# Patient Record
Sex: Male | Born: 1965 | Race: White | Hispanic: No | Marital: Married | Smoking: Never smoker
Health system: Southern US, Community
[De-identification: ages and names within clinical notes are randomized; demographics above are authoritative.]

## PROBLEM LIST (undated history)

## (undated) DIAGNOSIS — G4730 Sleep apnea, unspecified: Secondary | ICD-10-CM

## (undated) DIAGNOSIS — M109 Gout, unspecified: Secondary | ICD-10-CM

## (undated) DIAGNOSIS — I10 Essential (primary) hypertension: Secondary | ICD-10-CM

## (undated) DIAGNOSIS — E669 Obesity, unspecified: Secondary | ICD-10-CM

## (undated) HISTORY — DX: Gilbert syndrome: E80.4

## (undated) HISTORY — DX: Sleep apnea, unspecified: G47.30

## (undated) HISTORY — DX: Obesity, unspecified: E66.9

## (undated) HISTORY — PX: HERNIA REPAIR: SHX51

## (undated) HISTORY — DX: Essential (primary) hypertension: I10

## (undated) HISTORY — DX: Gout, unspecified: M10.9

## (undated) HISTORY — PX: CHOLECYSTECTOMY: SHX55

---

## 1999-06-30 ENCOUNTER — Ambulatory Visit (HOSPITAL_COMMUNITY): Admission: RE | Admit: 1999-06-30 | Discharge: 1999-06-30 | Payer: Self-pay | Admitting: *Deleted

## 2005-01-25 ENCOUNTER — Ambulatory Visit: Payer: Self-pay

## 2007-11-27 ENCOUNTER — Emergency Department (HOSPITAL_BASED_OUTPATIENT_CLINIC_OR_DEPARTMENT_OTHER): Admission: EM | Admit: 2007-11-27 | Discharge: 2007-11-27 | Payer: Self-pay | Admitting: Emergency Medicine

## 2010-08-19 NOTE — Cardiovascular Report (Signed)
[place]. [facility]  Patient:    Andrew Price, Andrew Price              MRN: [id number] Proc. Date: 06/30/99 Adm. Date:  [id number] Attending:  Meade Maw A CC:         Marinda Elk, M.D.                        Cardiac Catheterization  PROCEDURE PERFORMED:  Left heart catheterization, coronary angiography, single plane ventriculogram.  INDICATIONS FOR PROCEDURE:  Chest pain associated with positive stress test.  REFERRING PHYSICIAN:  Marinda Elk, M.D.  DESCRIPTION OF PROCEDURE:  After obtaining written informed consent, the patient was brought to the cardiac catheterization lab in the postabsorptive state. Preop sedation was achieved using IV Versed.  The right groin was prepped and draped n the usual sterile fashion.  The right femoral head was identified using radiographic technique.  A 6 French hemostasis sheath was placed into the right  femoral artery using modified Seldinger technique.  Selective coronary angiography was performed using a JL4, JR4 Judkins catheter.  Nonionic contrast was used and was hand injected.  A single plane ventriculogram was performed in the RAO position using a 6 French pigtail curved catheter.  Nonionic contrast was used and was hand injected.  A single plane ventriculogram was performed in the RAO position using a 6 French pigtail curved catheter.  Nonionic contrast was used and was power injected.  All catheter exchanges were made over a guide wire.  The hemostasis sheath was placed following each injection.  FINDINGS:  The aortic pressure is 127/85.  LV pressure is 121/20.  Single plane  ventriculogram revealed normal wall motion with ejection fraction of 65%.  Coronary angiography:  The left main coronary artery bifurcated into the left anterior descending and circumflex vessel.  There is no significant disease in he left main coronary artery.  Left anterior descending:  The left anterior descending  gave rise to a moderate D1, moderate D2 ended as an apical recurrent branch.  There is no significant disease in the left anterior descending or its branches.  Circumflex vessel:  The circumflex vessel gave rise to a moderate sized OM1, small OM2 and had no significant disease.  Right coronary artery:  The right coronary artery was a large artery, was dominant for the posterior circulation, gave rise to a large PDA and two large PL branches. There was no significant disease in the right coronary artery.  IMPRESSION: 1. Normal coronaries. 2. Normal left ventricular function.  RECOMMENDATIONS:  Consider other etiologies for his chest pain. DD:  06/30/99 TD:  06/30/99 Job: [id number]

## 2010-09-22 ENCOUNTER — Emergency Department (HOSPITAL_BASED_OUTPATIENT_CLINIC_OR_DEPARTMENT_OTHER)
Admission: EM | Admit: 2010-09-22 | Discharge: 2010-09-23 | Disposition: A | Discharge status: Home / Self Care | Payer: BC Managed Care – PPO | Attending: Emergency Medicine | Admitting: Emergency Medicine

## 2010-09-22 ENCOUNTER — Emergency Department (INDEPENDENT_AMBULATORY_CARE_PROVIDER_SITE_OTHER): Payer: BC Managed Care – PPO

## 2010-09-22 DIAGNOSIS — J029 Acute pharyngitis, unspecified: Secondary | ICD-10-CM | POA: Insufficient documentation

## 2010-09-22 DIAGNOSIS — R05 Cough: Secondary | ICD-10-CM

## 2010-09-22 DIAGNOSIS — E041 Nontoxic single thyroid nodule: Secondary | ICD-10-CM

## 2010-09-22 DIAGNOSIS — I10 Essential (primary) hypertension: Secondary | ICD-10-CM | POA: Insufficient documentation

## 2010-09-22 MED ORDER — IOHEXOL 300 MG/ML  SOLN: 80.0000 mL | Freq: Once | INTRAMUSCULAR | Status: AC | PRN

## 2010-09-23 LAB — CBC
HCT: 39.6 % (ref 39.0–52.0)
Hemoglobin: 15 g/dL (ref 13.0–17.0)
MCH: 32.7 pg (ref 26.0–34.0)
MCHC: 37.9 g/dL — ABNORMAL HIGH (ref 30.0–36.0)

## 2010-09-23 LAB — BASIC METABOLIC PANEL
CO2: 26 mEq/L (ref 19–32)
Calcium: 9.3 mg/dL (ref 8.4–10.5)
Glucose, Bld: 94 mg/dL (ref 70–99)
Potassium: 3.8 mEq/L (ref 3.5–5.1)
Sodium: 139 mEq/L (ref 135–145)

## 2010-09-23 LAB — DIFFERENTIAL
Lymphocytes Relative: 27 % (ref 12–46)
Monocytes Absolute: 0.6 10*3/uL (ref 0.1–1.0)
Monocytes Relative: 9 % (ref 3–12)
Neutro Abs: 4.2 10*3/uL (ref 1.7–7.7)

## 2010-09-23 MED ORDER — IOHEXOL 300 MG/ML  SOLN
80.0000 mL | Freq: Once | INTRAMUSCULAR | Status: AC | PRN
  Administered 2010-09-23: 80 mL via INTRAVENOUS

## 2010-12-07 ENCOUNTER — Other Ambulatory Visit (HOSPITAL_BASED_OUTPATIENT_CLINIC_OR_DEPARTMENT_OTHER): Payer: Self-pay | Admitting: Family Medicine

## 2010-12-07 DIAGNOSIS — E041 Nontoxic single thyroid nodule: Secondary | ICD-10-CM

## 2010-12-09 ENCOUNTER — Ambulatory Visit (HOSPITAL_BASED_OUTPATIENT_CLINIC_OR_DEPARTMENT_OTHER)
Admission: RE | Admit: 2010-12-09 | Discharge: 2010-12-09 | Disposition: A | Discharge status: Home / Self Care | Payer: BC Managed Care – PPO | Source: Ambulatory Visit | Attending: Family Medicine | Admitting: Family Medicine

## 2010-12-09 DIAGNOSIS — E049 Nontoxic goiter, unspecified: Secondary | ICD-10-CM | POA: Insufficient documentation

## 2010-12-09 DIAGNOSIS — E041 Nontoxic single thyroid nodule: Secondary | ICD-10-CM

## 2010-12-13 ENCOUNTER — Other Ambulatory Visit: Payer: Self-pay | Admitting: Family Medicine

## 2010-12-13 DIAGNOSIS — E041 Nontoxic single thyroid nodule: Secondary | ICD-10-CM

## 2010-12-19 ENCOUNTER — Ambulatory Visit
Admission: RE | Admit: 2010-12-19 | Discharge: 2010-12-19 | Disposition: A | Discharge status: Home / Self Care | Payer: BC Managed Care – PPO | Source: Ambulatory Visit | Attending: Family Medicine | Admitting: Family Medicine

## 2010-12-19 ENCOUNTER — Other Ambulatory Visit (HOSPITAL_COMMUNITY)
Admission: RE | Admit: 2010-12-19 | Discharge: 2010-12-19 | Disposition: A | Discharge status: Home / Self Care | Payer: BC Managed Care – PPO | Source: Ambulatory Visit | Attending: Interventional Radiology | Admitting: Interventional Radiology

## 2010-12-19 DIAGNOSIS — E049 Nontoxic goiter, unspecified: Secondary | ICD-10-CM | POA: Insufficient documentation

## 2010-12-19 DIAGNOSIS — E041 Nontoxic single thyroid nodule: Secondary | ICD-10-CM

## 2013-01-08 ENCOUNTER — Ambulatory Visit (INDEPENDENT_AMBULATORY_CARE_PROVIDER_SITE_OTHER): Payer: 59

## 2013-01-08 ENCOUNTER — Encounter: Payer: Self-pay | Admitting: Podiatry

## 2013-01-08 ENCOUNTER — Ambulatory Visit (INDEPENDENT_AMBULATORY_CARE_PROVIDER_SITE_OTHER): Payer: 59 | Admitting: Podiatry

## 2013-01-08 VITALS — BP 127/74 | HR 69 | Resp 16 | Ht 70.0 in | Wt 260.0 lb

## 2013-01-08 DIAGNOSIS — M722 Plantar fascial fibromatosis: Secondary | ICD-10-CM

## 2013-01-08 MED ORDER — TRIAMCINOLONE ACETONIDE 10 MG/ML IJ SUSP
5.0000 mg | Freq: Once | INTRAMUSCULAR | Status: AC
  Administered 2013-01-08: 5 mg via INTRA_ARTICULAR

## 2013-01-08 MED ORDER — MELOXICAM 15 MG PO TABS: 15.0000 mg | ORAL_TABLET | Freq: Every day | ORAL | Status: DC

## 2013-01-08 NOTE — Progress Notes (Signed)
N-sharp L-plantar heel right D-1 month O-gradual C-AM pain, worse at end of the day A-standing, walking, certain shoes T-massaging, soaks, stretching  **Wears dress shoes to work daily and spends a lot of time standing on concrete floors**

## 2013-01-08 NOTE — Progress Notes (Signed)
°  Subjective:    Patient ID: Andrew Price, male    DOB: [birth year], 47 y.o.   MRN: [id number]  HPI    Review of Systems     Objective:   Physical Exam        Assessment & Plan:

## 2013-01-09 NOTE — Progress Notes (Signed)
Subjective:     Patient ID: Andrew Price, male   DOB: 09-20-1965, 47 y.o.   MRN: [id number]  Foot Pain   47 year old male who complains of pain in his right heel one-month ration. He states the pain has intensified over that period of time and he is increasingly having difficulty with Ambulation  Review of Systems  All other systems reviewed and are negative.       Objective:   Physical Exam  Nursing note and vitals reviewed. Constitutional: He is oriented to person, place, and time.  Cardiovascular: Intact distal pulses.   Musculoskeletal: Normal range of motion.  Neurological: He is alert and oriented to person, place, and time. He has normal reflexes.  Skin: Skin is warm.   Upon palpation I noted pain to the medial calcaneal tubercle.    Assessment:     Plantar fasciitis of the right heel.    Plan:     Initial H&P performed. X-rays reviewed with patient. Injected the right plantar fascia 3 mg Kenalog 5 mg Xylocaine Marcaine mixture. Dispensed fascial brace. Tried Mobic 15 mg daily and advised on return in 1 week.

## 2013-01-09 NOTE — Progress Notes (Deleted)
Subjective:     Patient ID: Andrew Price, male   DOB: Oct 18, 1965, 47 y.o.   MRN: [id number]  HPI   Review of Systems     Objective:   Physical Exam     Assessment:     ***    Plan:     ***

## 2013-01-15 ENCOUNTER — Ambulatory Visit: Payer: Self-pay | Admitting: Podiatry

## 2013-01-20 ENCOUNTER — Ambulatory Visit (INDEPENDENT_AMBULATORY_CARE_PROVIDER_SITE_OTHER): Payer: 59 | Admitting: Podiatry

## 2013-01-20 ENCOUNTER — Encounter: Payer: Self-pay | Admitting: Podiatry

## 2013-01-20 VITALS — BP 134/80 | HR 79 | Resp 16

## 2013-01-20 DIAGNOSIS — M722 Plantar fascial fibromatosis: Secondary | ICD-10-CM

## 2013-01-20 MED ORDER — TRIAMCINOLONE ACETONIDE 10 MG/ML IJ SUSP
5.0000 mg | Freq: Once | INTRAMUSCULAR | Status: AC
  Administered 2013-01-20: 5 mg via INTRA_ARTICULAR

## 2013-01-20 NOTE — Patient Instructions (Signed)
Wear as much as possible and wear shoe with elevated heel on opposite foot

## 2013-01-20 NOTE — Progress Notes (Signed)
Subjective:     Patient ID: Andrew Price, male   DOB: 28-May-1965, 47 y.o.   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       Objective:   Physical Exam  Vitals reviewed. Cardiovascular: Intact distal pulses.   Musculoskeletal: Normal range of motion.  Neurological: He is alert.  Skin: Skin is warm.   upon palpation of the right center and medial band of the plantar fascia is very tender with mild edema noted     Assessment:     Plantar fasciitis right with acute inflammation worse on weightbearing    Plan:     Reviewed treatment options at this point him mobilization with air fracture walker was accomplished with all instructions on usage. Injected the right plantar fascia 3 mW Kenalog 5 mg Xylocaine Marcaine mixture to reduce inflammation

## 2013-02-08 ENCOUNTER — Encounter: Payer: Self-pay | Admitting: Cardiology

## 2013-02-10 ENCOUNTER — Ambulatory Visit (INDEPENDENT_AMBULATORY_CARE_PROVIDER_SITE_OTHER): Payer: 59 | Admitting: Cardiology

## 2013-02-10 ENCOUNTER — Ambulatory Visit: Payer: 59 | Admitting: Podiatry

## 2013-02-10 ENCOUNTER — Encounter: Payer: Self-pay | Admitting: Cardiology

## 2013-02-10 VITALS — BP 112/82 | HR 77 | Ht 70.0 in | Wt 251.0 lb

## 2013-02-10 DIAGNOSIS — E669 Obesity, unspecified: Secondary | ICD-10-CM

## 2013-02-10 DIAGNOSIS — G4730 Sleep apnea, unspecified: Secondary | ICD-10-CM | POA: Insufficient documentation

## 2013-02-10 DIAGNOSIS — I10 Essential (primary) hypertension: Secondary | ICD-10-CM

## 2013-02-10 NOTE — Patient Instructions (Signed)
Your physician recommends that you continue on your current medications as directed. Please refer to the Current Medication list given to you today.  Your physician wants you to follow-up in: 6 Months with Dr. Sherlyn Lick will receive a reminder letter in the mail two months in advance. If you don't receive a letter, please call our office to schedule the follow-up appointment.  We contacted [employer] about getting a download on  Your CPAP machine. They will be contacting you with in the next week.

## 2013-02-10 NOTE — Progress Notes (Signed)
  [address] [place], [place]  [address] Phone: [phone] Fax:  [phone]  Date:  02/10/2013   ID:  Andrew Price, DOB 1965/11/08, MRN [id number]  PCP:  Lenora Boys, MD  Cardiologist:  Armanda Magic, MD     History of Present Illness: Andrew Price is a 47 y.o. male with a history of OSA/ HTN and obesity who presents today for followup.  He is doing well with his CPAP therapy.  He says that the mask is leaking.  He uses a nasal pillow mask which he tolerates well.  He feels the pressure is adequate.  He feels rested when he awakens and has no daytime sleepiness.  He walks for exercise although it currently is limited due to a flare of plantar fasciitis.   Wt Readings from Last 3 Encounters:  02/10/13 251 lb (113.853 kg)  01/08/13 260 lb (117.935 kg)     Past Medical History  Diagnosis Date  . Gout   . Mild sleep apnea   . Gilbert syndrome   . Hypertension   . Obesity (BMI 30-39.9)     Current Outpatient Prescriptions  Medication Sig Dispense Refill  . colchicine 0.6 MG tablet Take 0.6 mg by mouth daily as needed.      . indomethacin (INDOCIN) 50 MG capsule Take 50 mg by mouth as needed.      Marland Kitchen lisinopril-hydrochlorothiazide (PRINZIDE,ZESTORETIC) 10-12.5 MG per tablet Take 1 tablet by mouth daily.       No current facility-administered medications for this visit.    Allergies:    Allergies  Allergen Reactions  . Flexeril [Cyclobenzaprine]     Hyperactive/Drowsiness    Social History:  The patient  reports that he has never smoked. He does not have any smokeless tobacco history on file. He reports that he drinks alcohol. He reports that he does not use illicit drugs.   Family History:  The patient's family history includes Breast cancer in his mother; Hyperlipidemia in his brother; Hypertension in his father; Ovarian cancer in his mother; Pulmonary fibrosis in his father.   ROS:  Please see the history of present illness.       All other systems reviewed and negative.   PHYSICAL EXAM: VS:  BP 112/82  Pulse 77  Ht 5\' 10"  (1.778 m)  Wt 251 lb (113.853 kg)  BMI 36.01 kg/m2 Well nourished, well developed, in no acute distress HEENT: normal Neck: no JVD Cardiac:  normal S1, S2; RRR; no murmur Lungs:  clear to auscultation bilaterally, no wheezing, rhonchi or rales Abd: soft, nontender, no hepatomegaly Ext: no edema Skin: warm and dry Neuro:  CNs 2-12 intact, no focal abnormalities noted       ASSESSMENT AND PLAN:  1. OSA on CPAP therapy  - I will get a download from his DME 2.  HTN - well controlled   - continue Lisinopril HCT 3.  Obesity - I encouraged him to try to exercise as much has his foot problems would let him  Followup with me in 6 months  Signed, Armanda Magic, MD 02/10/2013 11:23 AM

## 2013-10-01 ENCOUNTER — Encounter (HOSPITAL_BASED_OUTPATIENT_CLINIC_OR_DEPARTMENT_OTHER): Payer: Self-pay | Admitting: Emergency Medicine

## 2013-10-01 ENCOUNTER — Emergency Department (HOSPITAL_BASED_OUTPATIENT_CLINIC_OR_DEPARTMENT_OTHER)
Admission: EM | Admit: 2013-10-01 | Discharge: 2013-10-01 | Disposition: A | Discharge status: Home / Self Care | Payer: 59 | Attending: Emergency Medicine | Admitting: Emergency Medicine

## 2013-10-01 ENCOUNTER — Emergency Department (HOSPITAL_BASED_OUTPATIENT_CLINIC_OR_DEPARTMENT_OTHER): Payer: 59

## 2013-10-01 DIAGNOSIS — I10 Essential (primary) hypertension: Secondary | ICD-10-CM | POA: Insufficient documentation

## 2013-10-01 DIAGNOSIS — R079 Chest pain, unspecified: Secondary | ICD-10-CM

## 2013-10-01 DIAGNOSIS — R05 Cough: Secondary | ICD-10-CM | POA: Insufficient documentation

## 2013-10-01 DIAGNOSIS — E669 Obesity, unspecified: Secondary | ICD-10-CM | POA: Insufficient documentation

## 2013-10-01 DIAGNOSIS — R059 Cough, unspecified: Secondary | ICD-10-CM | POA: Insufficient documentation

## 2013-10-01 DIAGNOSIS — Z79899 Other long term (current) drug therapy: Secondary | ICD-10-CM | POA: Insufficient documentation

## 2013-10-01 DIAGNOSIS — M109 Gout, unspecified: Secondary | ICD-10-CM | POA: Insufficient documentation

## 2013-10-01 DIAGNOSIS — Z791 Long term (current) use of non-steroidal anti-inflammatories (NSAID): Secondary | ICD-10-CM | POA: Insufficient documentation

## 2013-10-01 LAB — BASIC METABOLIC PANEL
Anion gap: 15 (ref 5–15)
BUN: 15 mg/dL (ref 6–23)
CHLORIDE: 98 meq/L (ref 96–112)
CO2: 25 mEq/L (ref 19–32)
Calcium: 9.8 mg/dL (ref 8.4–10.5)
Creatinine, Ser: 1.1 mg/dL (ref 0.50–1.35)
GFR calc Af Amer: >90 mL/min (ref >90)
GFR, EST NON AFRICAN AMERICAN: 78 mL/min — AB (ref >90)
GLUCOSE: 94 mg/dL (ref 70–99)
POTASSIUM: 3.9 meq/L (ref 3.7–5.3)
SODIUM: 138 meq/L (ref 137–147)

## 2013-10-01 LAB — CBC
HEMATOCRIT: 44.9 % (ref 39.0–52.0)
HEMOGLOBIN: 16.6 g/dL (ref 13.0–17.0)
MCH: 33.3 pg (ref 26.0–34.0)
MCHC: 37 g/dL — AB (ref 30.0–36.0)
MCV: 90 fL (ref 78.0–100.0)
Platelets: 158 10*3/uL (ref 150–400)
RBC: 4.99 MIL/uL (ref 4.22–5.81)
RDW: 13.6 % (ref 11.5–15.5)
WBC: 11.1 10*3/uL — AB (ref 4.0–10.5)

## 2013-10-01 LAB — TROPONIN I: Troponin I: <0.3 ng/mL (ref <0.30)

## 2013-10-01 MED ORDER — ALBUTEROL SULFATE (2.5 MG/3ML) 0.083% IN NEBU
2.5000 mg | INHALATION_SOLUTION | RESPIRATORY_TRACT | Status: DC | PRN
  Administered 2013-10-01: 2.5 mg via RESPIRATORY_TRACT
  Filled 2013-10-01: qty 3

## 2013-10-01 NOTE — ED Provider Notes (Signed)
CSN: [id number]     Arrival date & time 10/01/13  1424 History   First MD Initiated Contact with Patient 10/01/13 1447     Chief Complaint  Patient presents with  . Chest Pain      HPI  Patient presents with some chest discomfort. He awakened during the night. He states he felt like a tight as well localized to his left upper chest adjacent to the sternum. He felt the need to  cough or takes deep breaths. Is able to fall back asleep. Upon awakening at 7 AM it was still present , and present through the course of the day. He feels somewhat better after coughing. Is taking Mucinex. Continues to have a frequent dry cough. No fevers. No chills. Is not dyspneic or frank or short of breath. No exertional symptoms.  Past Medical History  Diagnosis Date  . Gout   . Mild sleep apnea   . Gilbert syndrome   . Hypertension   . Obesity (BMI 30-39.9)    Past Surgical History  Procedure Laterality Date  . Cholecystectomy    . Hernia repair     Family History  Problem Relation Age of Onset  . Breast cancer Mother   . Ovarian cancer Mother   . Hypertension Father   . Pulmonary fibrosis Father   . Hyperlipidemia Brother    History  Substance Use Topics  . Smoking status: Never Smoker   . Smokeless tobacco: Not on file  . Alcohol Use: Yes     Comment: social    Review of Systems  Constitutional: Negative for fever, chills, diaphoresis, appetite change and fatigue.  HENT: Negative for mouth sores, sore throat and trouble swallowing.   Eyes: Negative for visual disturbance.  Respiratory: Positive for cough and chest tightness. Negative for shortness of breath and wheezing.   Cardiovascular: Positive for chest pain.  Gastrointestinal: Negative for nausea, vomiting, abdominal pain, diarrhea and abdominal distention.  Endocrine: Negative for polydipsia, polyphagia and polyuria.  Genitourinary: Negative for dysuria, frequency and hematuria.  Musculoskeletal: Negative for gait problem.   Skin: Negative for color change, pallor and rash.  Neurological: Negative for dizziness, syncope, light-headedness and headaches.  Hematological: Does not bruise/bleed easily.  Psychiatric/Behavioral: Negative for behavioral problems and confusion.      Allergies  Flexeril  Home Medications   Prior to Admission medications   Medication Sig Start Date End Date Taking? Authorizing Provider  colchicine 0.6 MG tablet Take 0.6 mg by mouth daily as needed.    Historical Provider, MD  indomethacin (INDOCIN) 50 MG capsule Take 50 mg by mouth as needed.    Historical Provider, MD  lisinopril-hydrochlorothiazide (PRINZIDE,ZESTORETIC) 10-12.5 MG per tablet Take 1 tablet by mouth daily.    Historical Provider, MD   BP 122/77  Pulse 77  Temp(Src) 98.5 F (36.9 C) (Oral)  Resp 12  SpO2 100% Physical Exam  Constitutional: He is oriented to person, place, and time. He appears well-developed and well-nourished. No distress.  HENT:  Head: Normocephalic.  Eyes: Conjunctivae are normal. Pupils are equal, round, and reactive to light. No scleral icterus.  Neck: Normal range of motion. Neck supple. No thyromegaly present.  Cardiovascular: Normal rate and regular rhythm.  Exam reveals no gallop and no friction rub.   No murmur heard. Pulmonary/Chest: Effort normal and breath sounds normal. No respiratory distress. He has no wheezes. He has no rales.  Abdominal: Soft. Bowel sounds are normal. He exhibits no distension. There is no tenderness. There is  no rebound.  Musculoskeletal: Normal range of motion.  Neurological: He is alert and oriented to person, place, and time.  Skin: Skin is warm and dry. No rash noted.  Psychiatric: He has a normal mood and affect. His behavior is normal.    ED Course  Procedures (including critical care time) Labs Review Labs Reviewed  CBC - Abnormal; Notable for the following:    WBC 11.1 (*)    MCHC 37.0 (*)    All other components within normal limits   BASIC METABOLIC PANEL - Abnormal; Notable for the following:    GFR calc non Af Amer 78 (*)    All other components within normal limits  TROPONIN I    Imaging Review Dg Chest 2 View  10/01/2013   CLINICAL DATA:  48 year old male with chest pain radiating from the center to the left. Shortness of breath and dizziness. Initial encounter.  EXAM: CHEST  2 VIEW  COMPARISON:  None.  FINDINGS: Lung volumes within normal limits. Normal cardiac size and mediastinal contours. Visualized tracheal air column is within normal limits. The lungs are clear. No pneumothorax or effusion. No acute osseous abnormality identified.  IMPRESSION: Negative, no acute cardiopulmonary abnormality.   Electronically Signed   By: Augusto GambleLee  Hall M.D.   On: 10/01/2013 15:26     EKG Interpretation   Date/Time:  Wednesday October 01 2013 14:29:40 EDT Ventricular Rate:  69 PR Interval:  148 QRS Duration: 88 QT Interval:  410 QTC Calculation: 439 R Axis:   49 Text Interpretation:  Normal sinus rhythm Normal ECG Confirmed by Fayrene FearingJAMES   MD, Giann Obara ([id number]) on 10/01/2013 2:48:01 PM      MDM   Final diagnoses:  Chest pain, unspecified chest pain type    Patient has a normal EKG. He has a normal troponin. This after 12 hours of symptoms. Has a heart score of 2 (Age, and Htn).  The clinical suspicion of this being cardiac is low. He had no improvement or change in symptoms based on one albuterol treatment. His normal chest x-ray. It is appropriate for outpatient treatment. Careful return precautions review worsening or changing symptoms. 13 Mucinex. Recheck with any other changes.    Rolland PorterMark Oreste Majeed, MD 10/01/13 [phone]

## 2013-10-01 NOTE — Discharge Instructions (Signed)
Continue your mucinex am and pm. Return here with any change in, or worsening symptoms.  Chest Pain (Nonspecific) It is often hard to give a specific diagnosis for the cause of chest pain. There is always a chance that your pain could be related to something serious, such as a heart attack or a blood clot in the lungs. You need to follow up with your health care provider for further evaluation. CAUSES   Heartburn.  Pneumonia or bronchitis.  Anxiety or stress.  Inflammation around your heart (pericarditis) or lung (pleuritis or pleurisy).  A blood clot in the lung.  A collapsed lung (pneumothorax). It can develop suddenly on its own (spontaneous pneumothorax) or from trauma to the chest.  Shingles infection (herpes zoster virus). The chest wall is composed of bones, muscles, and cartilage. Any of these can be the source of the pain.  The bones can be bruised by injury.  The muscles or cartilage can be strained by coughing or overwork.  The cartilage can be affected by inflammation and become sore (costochondritis). DIAGNOSIS  Lab tests or other studies may be needed to find the cause of your pain. Your health care provider may have you take a test called an ambulatory electrocardiogram (ECG). An ECG records your heartbeat patterns over a 24-hour period. You may also have other tests, such as:  Transthoracic echocardiogram (TTE). During echocardiography, sound waves are used to evaluate how blood flows through your heart.  Transesophageal echocardiogram (TEE).  Cardiac monitoring. This allows your health care provider to monitor your heart rate and rhythm in real time.  Holter monitor. This is a portable device that records your heartbeat and can help diagnose heart arrhythmias. It allows your health care provider to track your heart activity for several days, if needed.  Stress tests by exercise or by giving medicine that makes the heart beat faster. TREATMENT   Treatment  depends on what may be causing your chest pain. Treatment may include:  Acid blockers for heartburn.  Anti-inflammatory medicine.  Pain medicine for inflammatory conditions.  Antibiotics if an infection is present.  You may be advised to change lifestyle habits. This includes stopping smoking and avoiding alcohol, caffeine, and chocolate.  You may be advised to keep your head raised (elevated) when sleeping. This reduces the chance of acid going backward from your stomach into your esophagus. Most of the time, nonspecific chest pain will improve within 2-3 days with rest and mild pain medicine.  HOME CARE INSTRUCTIONS   If antibiotics were prescribed, take them as directed. Finish them even if you start to feel better.  For the next few days, avoid physical activities that bring on chest pain. Continue physical activities as directed.  Do not use any tobacco products, including cigarettes, chewing tobacco, or electronic cigarettes.  Avoid drinking alcohol.  Only take medicine as directed by your health care provider.  Follow your health care provider's suggestions for further testing if your chest pain does not go away.  Keep any follow-up appointments you made. If you do not go to an appointment, you could develop lasting (chronic) problems with pain. If there is any problem keeping an appointment, call to reschedule. SEEK MEDICAL CARE IF:   Your chest pain does not go away, even after treatment.  You have a rash with blisters on your chest.  You have a fever. SEEK IMMEDIATE MEDICAL CARE IF:   You have increased chest pain or pain that spreads to your arm, neck, jaw, back, or  abdomen.  You have shortness of breath.  You have an increasing cough, or you cough up blood.  You have severe back or abdominal pain.  You feel nauseous or vomit.  You have severe weakness.  You faint.  You have chills. This is an emergency. Do not wait to see if the pain will go away. Get  medical help at once. Call your local emergency services (911 in [place] Do not drive yourself to the hospital. MAKE SURE YOU:   Understand these instructions.  Will watch your condition.  Will get help right away if you are not doing well or get worse. Document Released: 12/28/2004 Document Revised: 03/25/2013 Document Reviewed: 10/24/2007 [facility] Patient Information 2015 [place], [place]. This information is not intended to replace advice given to you by your health care provider. Make sure you discuss any questions you have with your health care provider.

## 2013-10-01 NOTE — ED Notes (Signed)
Pt c/o Cp x 12 hrs

## 2014-05-05 ENCOUNTER — Telehealth: Payer: Self-pay | Admitting: Cardiology

## 2014-05-05 NOTE — Telephone Encounter (Signed)
Left pt a message to call back. 

## 2014-05-05 NOTE — Telephone Encounter (Signed)
New message     Want to pick up presc for his cpap machine.  He has one at home, he travels and want to purchase a mini machine to take with him when he travels.  He found a company to buy one but they need the presc.  He is leaving for [place] soon and want to come by today and pick it up.  Please call when ready

## 2014-05-05 NOTE — Telephone Encounter (Signed)
Follow up      Pt has the fax number on where to fax the cpap presc.  Please fax it to the [facility]AP Shop at [phone].

## 2014-05-05 NOTE — Telephone Encounter (Signed)
I need sleep study and CPAP notes from [facility] and last OV note from [facility] as well as last download

## 2014-05-05 NOTE — Telephone Encounter (Signed)
Pt had placed an order for a mini C-pap machine, to take when he travel. The company where he placed the order has recommended that he needs a prescription with settings, before they can dispense the C-pap machine. Pt has had the home  C-pap machine for 2 years and the settings have been the same as  recommended when he had  the sleep apnea study. Pt would like to have the prescription fax to  The C-PAP shop at [phone].

## 2014-05-06 NOTE — Telephone Encounter (Addendum)
Contacted Nicholle in Medical records at [facility] for her to send sleep study, PAP notes and last OV.   Left message for patient to call back regarding current DME and to update him.

## 2014-05-07 NOTE — Telephone Encounter (Signed)
New Message  Dorris with the CPAP shop called states that they faxed over a form requesting cpap and to have it set up for the patient. They have not recieved a response. No have they received the form. Will resend the form, please sign and send back. Please call back to discuss if needed

## 2014-05-07 NOTE — Telephone Encounter (Signed)
Informed Doris that we are waiting for records to be received before writing a new Rx.

## 2014-05-07 NOTE — Telephone Encounter (Signed)
Follow Up  Pt returning Katy's phone call concerning CPAP prescription. Please call back and discuss.

## 2014-05-11 NOTE — Telephone Encounter (Signed)
Called patient again to ask who is his DME. Patient out of town for 2 weeks.

## 2015-02-08 ENCOUNTER — Other Ambulatory Visit: Payer: Self-pay | Admitting: Physician Assistant

## 2015-02-08 DIAGNOSIS — R1032 Left lower quadrant pain: Secondary | ICD-10-CM

## 2015-02-11 ENCOUNTER — Ambulatory Visit
Admission: RE | Admit: 2015-02-11 | Discharge: 2015-02-11 | Disposition: A | Discharge status: Home / Self Care | Payer: 59 | Source: Ambulatory Visit | Attending: Physician Assistant | Admitting: Physician Assistant

## 2015-02-11 DIAGNOSIS — R1032 Left lower quadrant pain: Secondary | ICD-10-CM

## 2015-02-11 MED ORDER — IOPAMIDOL (ISOVUE-300) INJECTION 61%
125.0000 mL | Freq: Once | INTRAVENOUS | Status: AC | PRN
  Administered 2015-02-11: 125 mL via INTRAVENOUS

## 2015-03-15 ENCOUNTER — Other Ambulatory Visit: Payer: Self-pay | Admitting: Gastroenterology

## 2018-06-14 ENCOUNTER — Other Ambulatory Visit (HOSPITAL_BASED_OUTPATIENT_CLINIC_OR_DEPARTMENT_OTHER): Payer: Self-pay | Admitting: Physician Assistant

## 2018-06-14 DIAGNOSIS — R599 Enlarged lymph nodes, unspecified: Secondary | ICD-10-CM

## 2018-06-14 DIAGNOSIS — R7989 Other specified abnormal findings of blood chemistry: Secondary | ICD-10-CM

## 2018-06-14 DIAGNOSIS — R945 Abnormal results of liver function studies: Secondary | ICD-10-CM

## 2018-06-17 ENCOUNTER — Other Ambulatory Visit: Payer: Self-pay

## 2018-06-17 ENCOUNTER — Ambulatory Visit (HOSPITAL_BASED_OUTPATIENT_CLINIC_OR_DEPARTMENT_OTHER)
Admission: RE | Admit: 2018-06-17 | Discharge: 2018-06-17 | Disposition: A | Discharge status: Home / Self Care | Payer: 59 | Source: Ambulatory Visit | Attending: Physician Assistant | Admitting: Physician Assistant

## 2018-06-17 DIAGNOSIS — R7989 Other specified abnormal findings of blood chemistry: Secondary | ICD-10-CM

## 2018-06-17 DIAGNOSIS — R599 Enlarged lymph nodes, unspecified: Secondary | ICD-10-CM | POA: Diagnosis present

## 2018-06-17 DIAGNOSIS — R945 Abnormal results of liver function studies: Secondary | ICD-10-CM

## 2018-06-20 ENCOUNTER — Other Ambulatory Visit: Payer: Self-pay | Admitting: Physician Assistant

## 2018-06-20 DIAGNOSIS — M7989 Other specified soft tissue disorders: Secondary | ICD-10-CM

## 2018-06-24 ENCOUNTER — Other Ambulatory Visit: Payer: Self-pay | Admitting: Physician Assistant

## 2018-06-24 DIAGNOSIS — R590 Localized enlarged lymph nodes: Secondary | ICD-10-CM

## 2018-07-08 ENCOUNTER — Other Ambulatory Visit: Payer: Self-pay

## 2018-07-08 ENCOUNTER — Ambulatory Visit
Admission: RE | Admit: 2018-07-08 | Discharge: 2018-07-08 | Disposition: A | Discharge status: Home / Self Care | Payer: 59 | Source: Ambulatory Visit | Attending: Physician Assistant | Admitting: Physician Assistant

## 2018-07-08 ENCOUNTER — Ambulatory Visit
Admission: RE | Admit: 2018-07-08 | Discharge: 2018-07-08 | Disposition: A | Discharge status: Home / Self Care | Payer: No Typology Code available for payment source | Source: Ambulatory Visit | Attending: Physician Assistant | Admitting: Physician Assistant

## 2018-07-08 DIAGNOSIS — R590 Localized enlarged lymph nodes: Secondary | ICD-10-CM

## 2018-12-04 ENCOUNTER — Other Ambulatory Visit: Payer: Self-pay | Admitting: *Deleted

## 2018-12-04 DIAGNOSIS — Z20822 Contact with and (suspected) exposure to covid-19: Secondary | ICD-10-CM

## 2018-12-05 LAB — NOVEL CORONAVIRUS, NAA: SARS-CoV-2, NAA: NOT DETECTED

## 2019-01-15 ENCOUNTER — Other Ambulatory Visit: Payer: Self-pay

## 2019-01-15 DIAGNOSIS — Z20822 Contact with and (suspected) exposure to covid-19: Secondary | ICD-10-CM

## 2019-01-16 LAB — NOVEL CORONAVIRUS, NAA: SARS-CoV-2, NAA: NOT DETECTED

## 2019-01-21 ENCOUNTER — Other Ambulatory Visit: Payer: Self-pay | Admitting: Registered"

## 2019-01-21 DIAGNOSIS — Z20822 Contact with and (suspected) exposure to covid-19: Secondary | ICD-10-CM

## 2019-01-22 LAB — NOVEL CORONAVIRUS, NAA: SARS-CoV-2, NAA: NOT DETECTED

## 2019-12-25 ENCOUNTER — Other Ambulatory Visit: Payer: Self-pay | Admitting: Nurse Practitioner

## 2019-12-25 DIAGNOSIS — R109 Unspecified abdominal pain: Secondary | ICD-10-CM

## 2020-01-08 ENCOUNTER — Other Ambulatory Visit: Payer: No Typology Code available for payment source

## 2020-01-13 ENCOUNTER — Other Ambulatory Visit: Payer: Self-pay

## 2020-01-13 ENCOUNTER — Ambulatory Visit
Admission: RE | Admit: 2020-01-13 | Discharge: 2020-01-13 | Disposition: A | Discharge status: Home / Self Care | Payer: No Typology Code available for payment source | Source: Ambulatory Visit | Attending: Nurse Practitioner | Admitting: Nurse Practitioner

## 2020-01-13 DIAGNOSIS — R109 Unspecified abdominal pain: Secondary | ICD-10-CM

## 2020-01-20 ENCOUNTER — Other Ambulatory Visit: Payer: Self-pay | Admitting: Nurse Practitioner

## 2020-01-20 DIAGNOSIS — N2889 Other specified disorders of kidney and ureter: Secondary | ICD-10-CM

## 2020-02-02 ENCOUNTER — Ambulatory Visit
Admission: RE | Admit: 2020-02-02 | Discharge: 2020-02-02 | Disposition: A | Discharge status: Home / Self Care | Payer: No Typology Code available for payment source | Source: Ambulatory Visit | Attending: Nurse Practitioner | Admitting: Nurse Practitioner

## 2020-02-02 DIAGNOSIS — N2889 Other specified disorders of kidney and ureter: Secondary | ICD-10-CM

## 2020-02-06 ENCOUNTER — Other Ambulatory Visit: Payer: Self-pay | Admitting: Nurse Practitioner

## 2020-02-06 DIAGNOSIS — N281 Cyst of kidney, acquired: Secondary | ICD-10-CM

## 2020-03-02 ENCOUNTER — Ambulatory Visit
Admission: RE | Admit: 2020-03-02 | Discharge: 2020-03-02 | Disposition: A | Discharge status: Home / Self Care | Payer: 59 | Source: Ambulatory Visit | Attending: Nurse Practitioner | Admitting: Nurse Practitioner

## 2020-03-02 ENCOUNTER — Other Ambulatory Visit: Payer: Self-pay

## 2020-03-02 DIAGNOSIS — N281 Cyst of kidney, acquired: Secondary | ICD-10-CM

## 2020-03-02 MED ORDER — GADOBENATE DIMEGLUMINE 529 MG/ML IV SOLN
20.0000 mL | Freq: Once | INTRAVENOUS | Status: AC | PRN
  Administered 2020-03-02: 20 mL via INTRAVENOUS

## 2021-05-30 IMAGING — MR MR ABDOMEN WO/W CM
17 series · 48 of 48 positions shown · IV contrast (multihance)
Comparison: Renal ultrasound 02/02/2020 and CT abdomen and pelvis
01/13/2020.

CLINICAL DATA: Flank pain radiating to the groin area x3 months
with abnormal finding on recent CT and ultrasound.

EXAM:
MRI ABDOMEN WITHOUT AND WITH CONTRAST
TECHNIQUE: Multiplanar multisequence MR imaging of the abdomen was performed
both before and after the administration of intravenous contrast.
CONTRAST:  20mL MULTIHANCE GADOBENATE DIMEGLUMINE 529 MG/ML IV SOLN

[Series 3: T2 · coronal · 5.0mm · 1.76mm/px · 2 of 42 slices shown (1 of 3)]
[im 1/42]
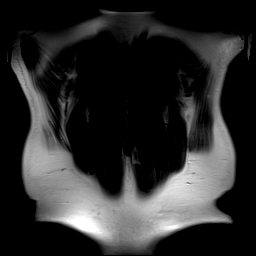
[im 42/42]
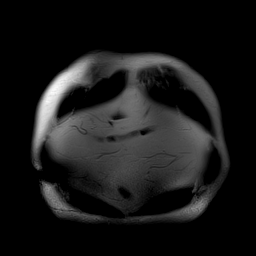

[Series 4: T1 · axial · 3.0mm · 1.41mm/px · z∈[-189,+24]mm · 5 of 144 slices shown]
[im 1/144]
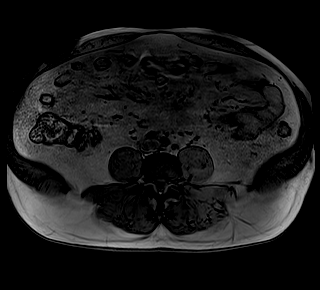
[im 36/144]
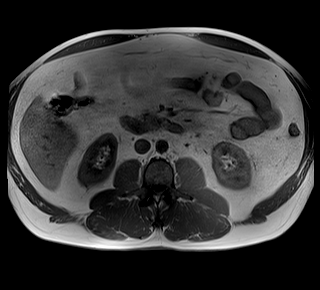
[im 72/144]
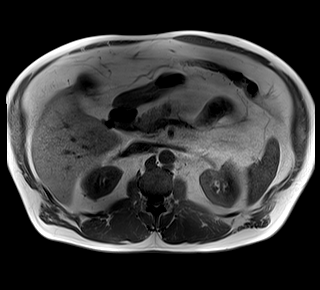
[im 108/144]
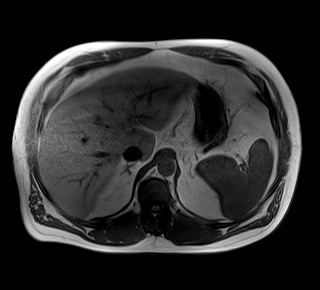
[im 144/144]
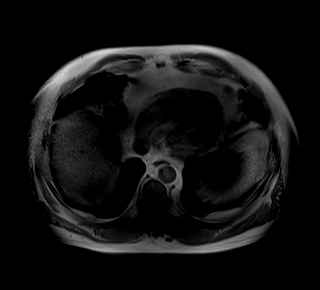

[Series 5: T2 · axial · 5.0mm · 1.76mm/px · z∈[-185,+61]mm · 2 of 42 slices shown (2 of 3)]
[im 1/42]
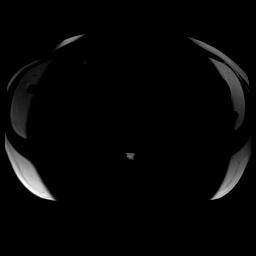
[im 42/42]
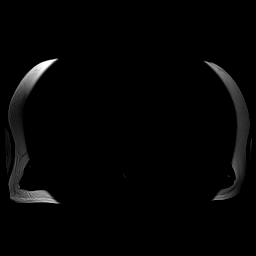

[Series 6: DWI · axial · 5.0mm · 1.68mm/px · z∈[-185,+61]mm · 5 of 126 slices shown (1 of 2)]
[im 1/126]
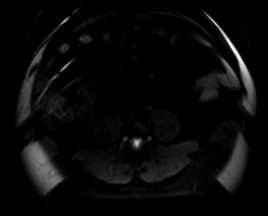
[im 32/126]
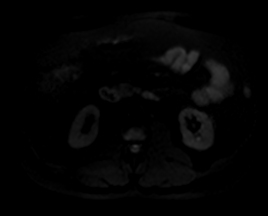
[im 63/126]
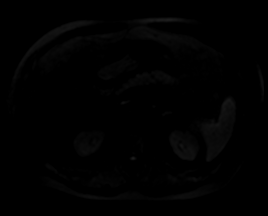
[im 94/126]
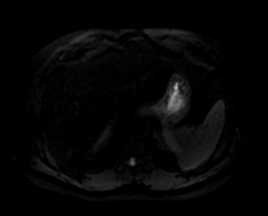
[im 126/126]
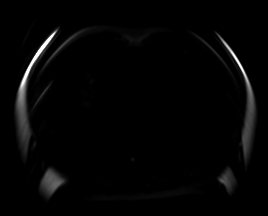

[Series 7: DWI · axial · 5.0mm · 1.68mm/px · z∈[-185,+61]mm · 2 of 42 slices shown (2 of 2)]
[im 1/42]
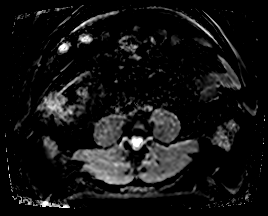
[im 42/42]
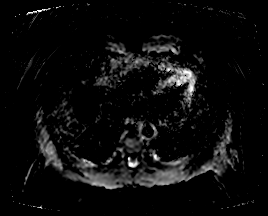

[Series 9: bSSFP · axial · 5.0mm · 1.41mm/px · 1 of 38 slices shown]
[im 1/38]
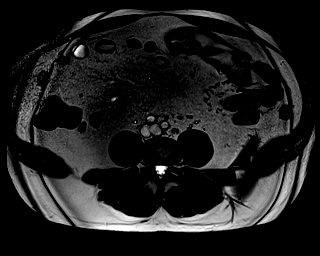

[Series 10: T2 · axial · 6.0mm · 1.41mm/px · 1 of 33 slices shown (3 of 3)]
[im 1/33]
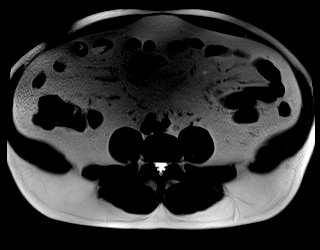

[Series 11: T1 dynamic · axial · non-contrast · 3.0mm · 1.41mm/px · z∈[-195,+42]mm · 3 of 80 slices shown]
[im 1/80]
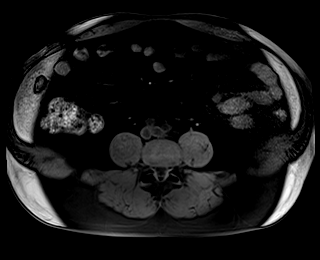
[im 40/80]
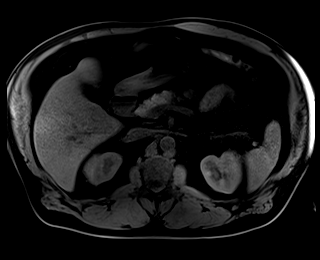
[im 80/80]
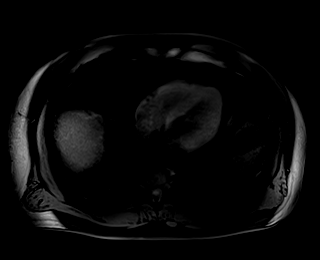

[Series 12: T1 dynamic post-contrast · axial · 3.0mm · 1.41mm/px · z∈[-195,+42]mm · 3 of 80 slices shown (1 of 9)]
[im 1/80]
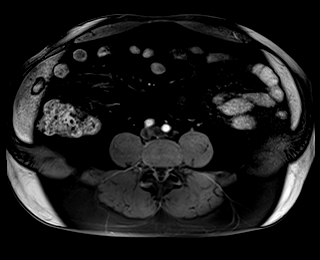
[im 40/80]
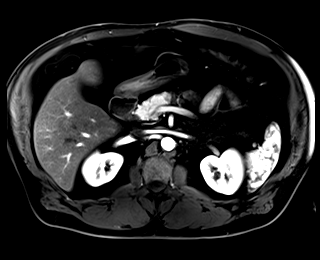
[im 80/80]
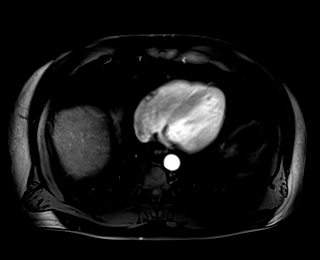

[Series 13: T1 dynamic post-contrast · axial · 3.0mm · 1.41mm/px · z∈[-195,+42]mm · 3 of 80 slices shown (2 of 9)]
[im 1/80]
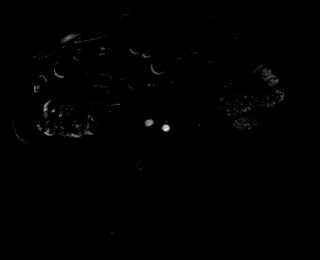
[im 40/80]
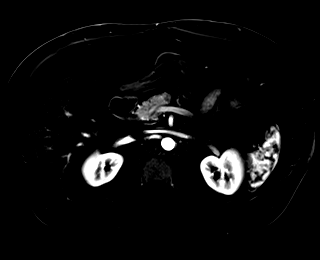
[im 80/80]
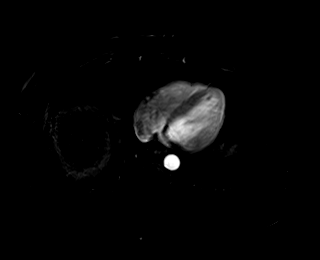

[Series 14: T1 dynamic post-contrast · axial · 3.0mm · 1.41mm/px · z∈[-195,+42]mm · 3 of 80 slices shown (3 of 9)]
[im 1/80]
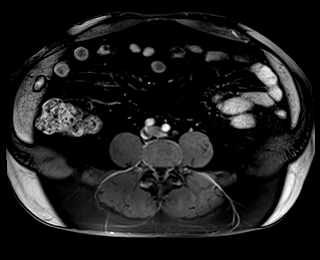
[im 40/80]
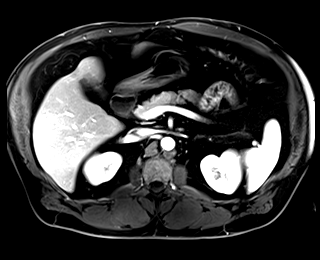
[im 80/80]
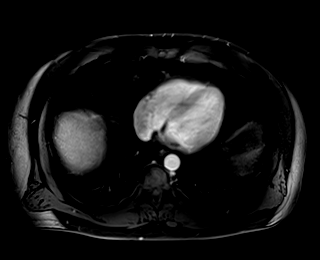

[Series 15: T1 dynamic post-contrast · axial · 3.0mm · 1.41mm/px · z∈[-195,+42]mm · 3 of 80 slices shown (4 of 9)]
[im 1/80]
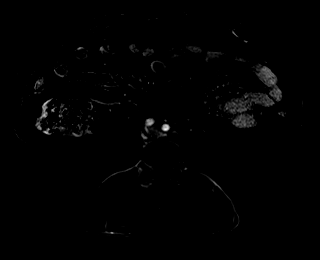
[im 40/80]
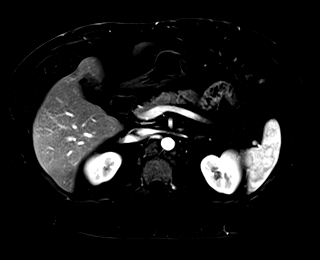
[im 80/80]
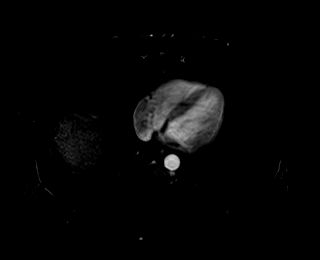

[Series 16: T1 dynamic post-contrast · axial · 3.0mm · 1.41mm/px · z∈[-195,+42]mm · 3 of 80 slices shown (5 of 9)]
[im 1/80]
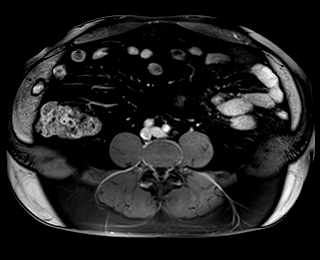
[im 40/80]
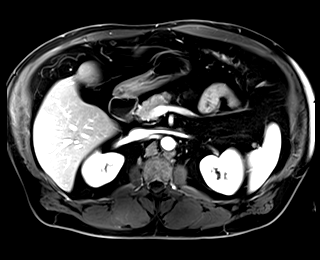
[im 80/80]
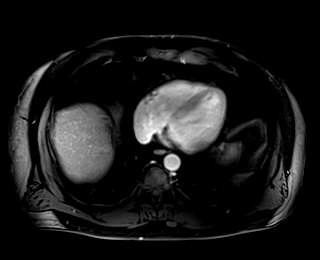

[Series 17: T1 dynamic post-contrast · axial · 3.0mm · 1.41mm/px · z∈[-195,+42]mm · 3 of 80 slices shown (6 of 9)]
[im 1/80]
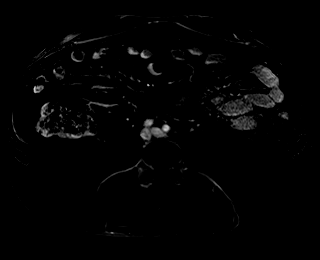
[im 40/80]
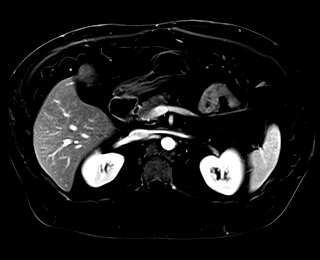
[im 80/80]
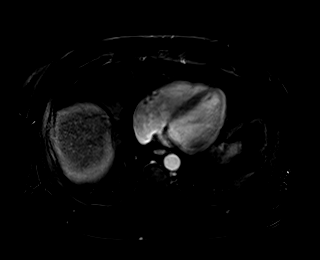

[Series 18: T1 dynamic post-contrast · coronal · 3.0mm · 1.41mm/px · 3 of 80 slices shown (7 of 9)]
[im 1/80]
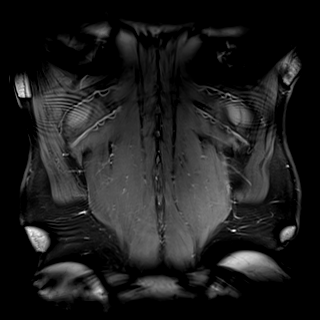
[im 40/80]
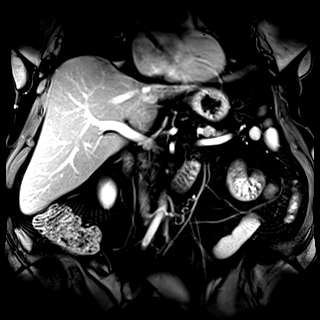
[im 80/80]
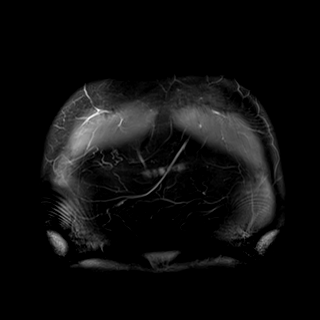

[Series 19: T1 dynamic post-contrast · axial · 3.0mm · 1.41mm/px · z∈[-195,+42]mm · 3 of 80 slices shown (8 of 9)]
[im 1/80]
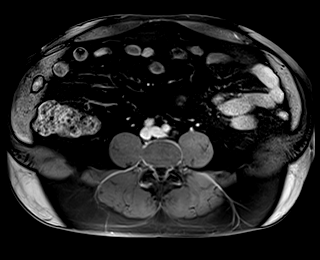
[im 40/80]
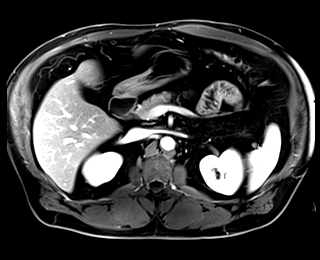
[im 80/80]
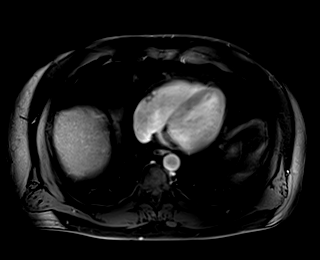

[Series 20: T1 dynamic post-contrast · axial · 3.0mm · 1.41mm/px · z∈[-195,+42]mm · 3 of 80 slices shown (9 of 9)]
[im 1/80]
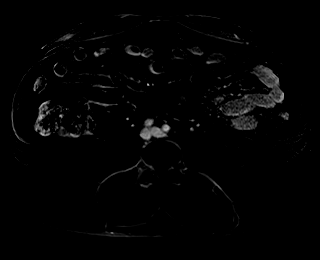
[im 40/80]
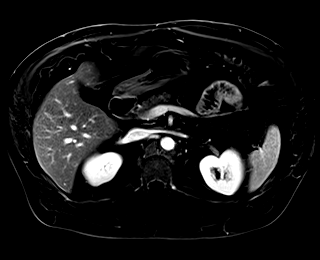
[im 80/80]
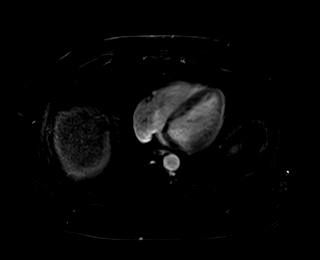

[48 of 48 positions shown; findings below may reference images not displayed]

FINDINGS: Lower chest: No acute findings.

Hepatobiliary: Moderate hepatic steatosis with a fat fraction of
25%. No suspicious hepatic lesions. Cholecystectomy.

Pancreas: No mass, inflammatory changes, or other parenchymal
abnormality identified.

Spleen: Within normal limits in size and appearance. Small splenule.

Adrenals/Urinary Tract:  Bilateral adrenal glands appear normal.

No hydronephrosis. There is a 2.2 cm cystic left renal lesion
without enhancing septations or soft tissue nodularity (series 5
image 29). No solid enhancing renal lesions.

Stomach/Bowel: Visualized portions within the abdomen are
unremarkable.

Vascular/Lymphatic: No pathologically enlarged lymph nodes
identified. No abdominal aortic aneurysm demonstrated.

Other:  None.

Musculoskeletal: No suspicious bone lesions identified.
IMPRESSION: 1. Cystic 2.2 cm left renal lesion without enhancing septations or
soft tissue nodularity, which corresponds with lesion visualized on
prior CT and ultrasound and is most consistent with a benign cyst.
No suspicious renal lesions.
2. Moderate hepatic steatosis with a fat fraction of 25%.

## 2022-08-05 ENCOUNTER — Inpatient Hospital Stay (HOSPITAL_COMMUNITY)
Admission: EM | Admit: 2022-08-05 | Discharge: 2022-08-08 | DRG: 440 | Disposition: A | Discharge status: Home / Self Care | Payer: 59 | Attending: Internal Medicine | Admitting: Internal Medicine

## 2022-08-05 ENCOUNTER — Emergency Department (HOSPITAL_COMMUNITY): Payer: 59

## 2022-08-05 ENCOUNTER — Other Ambulatory Visit: Payer: Self-pay

## 2022-08-05 ENCOUNTER — Encounter (HOSPITAL_COMMUNITY): Payer: Self-pay | Admitting: Radiology

## 2022-08-05 DIAGNOSIS — Z79899 Other long term (current) drug therapy: Secondary | ICD-10-CM | POA: Diagnosis not present

## 2022-08-05 DIAGNOSIS — M109 Gout, unspecified: Secondary | ICD-10-CM | POA: Diagnosis present

## 2022-08-05 DIAGNOSIS — R509 Fever, unspecified: Secondary | ICD-10-CM | POA: Diagnosis not present

## 2022-08-05 DIAGNOSIS — Z888 Allergy status to other drugs, medicaments and biological substances status: Secondary | ICD-10-CM

## 2022-08-05 DIAGNOSIS — K852 Alcohol induced acute pancreatitis without necrosis or infection: Secondary | ICD-10-CM | POA: Diagnosis present

## 2022-08-05 DIAGNOSIS — E785 Hyperlipidemia, unspecified: Secondary | ICD-10-CM | POA: Diagnosis present

## 2022-08-05 DIAGNOSIS — K859 Acute pancreatitis without necrosis or infection, unspecified: Principal | ICD-10-CM

## 2022-08-05 DIAGNOSIS — I10 Essential (primary) hypertension: Secondary | ICD-10-CM | POA: Diagnosis present

## 2022-08-05 DIAGNOSIS — Z8249 Family history of ischemic heart disease and other diseases of the circulatory system: Secondary | ICD-10-CM

## 2022-08-05 LAB — URINALYSIS, ROUTINE W REFLEX MICROSCOPIC
Bilirubin Urine: NEGATIVE
Glucose, UA: >=500 mg/dL — AB
Hgb urine dipstick: NEGATIVE
Ketones, ur: 5 mg/dL — AB
Leukocytes,Ua: NEGATIVE
Nitrite: NEGATIVE
Protein, ur: 30 mg/dL — AB
Specific Gravity, Urine: 1.025 (ref 1.005–1.030)
pH: 5 (ref 5.0–8.0)

## 2022-08-05 LAB — CBC WITH DIFFERENTIAL/PLATELET
Abs Immature Granulocytes: 0.03 10*3/uL (ref 0.00–0.07)
Basophils Absolute: 0 10*3/uL (ref 0.0–0.1)
Basophils Relative: 0 %
Eosinophils Absolute: 0.1 10*3/uL (ref 0.0–0.5)
Eosinophils Relative: 1 %
HCT: 46 % (ref 39.0–52.0)
Hemoglobin: 16.7 g/dL (ref 13.0–17.0)
Immature Granulocytes: 0 %
Lymphocytes Relative: 11 %
Lymphs Abs: 1.3 10*3/uL (ref 0.7–4.0)
MCH: 33.3 pg (ref 26.0–34.0)
MCHC: 36.3 g/dL — ABNORMAL HIGH (ref 30.0–36.0)
MCV: 91.6 fL (ref 80.0–100.0)
Monocytes Absolute: 1 10*3/uL (ref 0.1–1.0)
Monocytes Relative: 9 %
Neutro Abs: 8.6 10*3/uL — ABNORMAL HIGH (ref 1.7–7.7)
Neutrophils Relative %: 79 %
Platelets: 151 10*3/uL (ref 150–400)
RBC: 5.02 MIL/uL (ref 4.22–5.81)
RDW: 13.5 % (ref 11.5–15.5)
WBC: 11 10*3/uL — ABNORMAL HIGH (ref 4.0–10.5)
nRBC: 0 % (ref 0.0–0.2)

## 2022-08-05 LAB — COMPREHENSIVE METABOLIC PANEL
ALT: 81 U/L — ABNORMAL HIGH (ref 0–44)
AST: 57 U/L — ABNORMAL HIGH (ref 15–41)
Albumin: 4.3 g/dL (ref 3.5–5.0)
Alkaline Phosphatase: 69 U/L (ref 38–126)
Anion gap: 11 (ref 5–15)
BUN: 13 mg/dL (ref 6–20)
CO2: 24 mmol/L (ref 22–32)
Calcium: 9.1 mg/dL (ref 8.9–10.3)
Chloride: 93 mmol/L — ABNORMAL LOW (ref 98–111)
Creatinine, Ser: 0.8 mg/dL (ref 0.61–1.24)
GFR, Estimated: >60 mL/min (ref >60)
Glucose, Bld: 230 mg/dL — ABNORMAL HIGH (ref 70–99)
Potassium: 3.6 mmol/L (ref 3.5–5.1)
Sodium: 128 mmol/L — ABNORMAL LOW (ref 135–145)
Total Bilirubin: 3.1 mg/dL — ABNORMAL HIGH (ref 0.3–1.2)
Total Protein: 8.1 g/dL (ref 6.5–8.1)

## 2022-08-05 LAB — GLUCOSE, CAPILLARY: Glucose-Capillary: 193 mg/dL — ABNORMAL HIGH (ref 70–99)

## 2022-08-05 LAB — LIPASE, BLOOD: Lipase: 1106 U/L — ABNORMAL HIGH (ref 11–51)

## 2022-08-05 MED ORDER — LACTATED RINGERS IV SOLN: INTRAVENOUS | Status: DC

## 2022-08-05 MED ORDER — ONDANSETRON HCL 4 MG/2ML IJ SOLN
4.0000 mg | Freq: Once | INTRAMUSCULAR | Status: AC
  Administered 2022-08-05: 4 mg via INTRAVENOUS
  Filled 2022-08-05: qty 2

## 2022-08-05 MED ORDER — ONDANSETRON HCL 4 MG PO TABS: 4.0000 mg | ORAL_TABLET | Freq: Four times a day (QID) | ORAL | Status: DC | PRN

## 2022-08-05 MED ORDER — SODIUM CHLORIDE (PF) 0.9 % IJ SOLN
INTRAMUSCULAR | Status: AC
  Filled 2022-08-05: qty 50

## 2022-08-05 MED ORDER — ENOXAPARIN SODIUM 60 MG/0.6ML IJ SOSY
60.0000 mg | PREFILLED_SYRINGE | INTRAMUSCULAR | Status: DC
  Administered 2022-08-05 – 2022-08-07 (×2): 60 mg via SUBCUTANEOUS
  Filled 2022-08-05 (×3): qty 0.6

## 2022-08-05 MED ORDER — MORPHINE SULFATE (PF) 4 MG/ML IV SOLN
4.0000 mg | Freq: Once | INTRAVENOUS | Status: AC
  Administered 2022-08-05: 4 mg via INTRAVENOUS
  Filled 2022-08-05: qty 1

## 2022-08-05 MED ORDER — TRAMADOL HCL 50 MG PO TABS
50.0000 mg | ORAL_TABLET | Freq: Three times a day (TID) | ORAL | Status: DC | PRN
  Administered 2022-08-07 – 2022-08-08 (×4): 50 mg via ORAL
  Filled 2022-08-05 (×4): qty 1

## 2022-08-05 MED ORDER — POLYETHYLENE GLYCOL 3350 17 G PO PACK: 17.0000 g | PACK | Freq: Every day | ORAL | Status: DC | PRN

## 2022-08-05 MED ORDER — HYDROMORPHONE HCL 1 MG/ML IJ SOLN
0.5000 mg | INTRAMUSCULAR | Status: DC | PRN
  Administered 2022-08-05 – 2022-08-07 (×12): 0.5 mg via INTRAVENOUS
  Filled 2022-08-05 (×12): qty 0.5

## 2022-08-05 MED ORDER — BISACODYL 5 MG PO TBEC: 5.0000 mg | DELAYED_RELEASE_TABLET | Freq: Every day | ORAL | Status: DC | PRN

## 2022-08-05 MED ORDER — ACETAMINOPHEN 325 MG PO TABS
325.0000 mg | ORAL_TABLET | ORAL | Status: DC | PRN
  Administered 2022-08-05 – 2022-08-08 (×8): 325 mg via ORAL
  Filled 2022-08-05 (×9): qty 1

## 2022-08-05 MED ORDER — IOHEXOL 300 MG/ML  SOLN
100.0000 mL | Freq: Once | INTRAMUSCULAR | Status: AC | PRN
  Administered 2022-08-05: 100 mL via INTRAVENOUS

## 2022-08-05 MED ORDER — SODIUM CHLORIDE 0.9 % IV BOLUS
1000.0000 mL | Freq: Once | INTRAVENOUS | Status: AC
  Administered 2022-08-05: 1000 mL via INTRAVENOUS

## 2022-08-05 MED ORDER — LORATADINE 10 MG PO TABS: 10.0000 mg | ORAL_TABLET | Freq: Every day | ORAL | Status: DC | PRN

## 2022-08-05 MED ORDER — ONDANSETRON HCL 4 MG/2ML IJ SOLN
4.0000 mg | Freq: Four times a day (QID) | INTRAMUSCULAR | Status: DC | PRN
  Administered 2022-08-07: 4 mg via INTRAVENOUS
  Filled 2022-08-05 (×2): qty 2

## 2022-08-05 MED ORDER — LISINOPRIL 10 MG PO TABS
10.0000 mg | ORAL_TABLET | Freq: Every day | ORAL | Status: DC
  Administered 2022-08-05 – 2022-08-08 (×4): 10 mg via ORAL
  Filled 2022-08-05 (×4): qty 1

## 2022-08-05 NOTE — ED Triage Notes (Signed)
Pt presents from [facility] in with swollen abd starting yesterday. Feels nauseated. Belching has decreased some. Last food intake 1800 yesterday. Last BM today

## 2022-08-05 NOTE — ED Notes (Signed)
ED TO INPATIENT HANDOFF REPORT  ED Nurse Name and Phone #: Arnetha Gula Name/Age/Gender [name] 57 y.o. male Room/Bed: WA09/WA09  Code Status   Code Status: Full Code  Home/SNF/Other Home Patient oriented to: self, place, time, and situation Is this baseline? Yes   Triage Complete: Triage complete  Chief Complaint Acute pancreatitis [K85.90]  Triage Note Pt presents from [facility] in with swollen abd starting yesterday. Feels nauseated. Belching has decreased some. Last food intake 1800 yesterday. Last BM today   Allergies Allergies  Allergen Reactions   Flexeril [Cyclobenzaprine]     Hyperactive/Drowsiness    Level of Care/Admitting Diagnosis ED Disposition     ED Disposition  Admit   Condition  --   Comment  Hospital Area: [facility] [[id number]]  Level of Care: Med-Surg [16]  May admit patient to [facility] or [facility] if equivalent level of care is available:: Yes  Covid Evaluation: Confirmed COVID Negative  Diagnosis: Acute pancreatitis [577.0.ICD-9-CM]  Admitting Physician: Azucena Fallen [[id number]]  Attending Physician: Azucena Fallen [[id number]]  Certification:: I certify this patient will need inpatient services for at least 2 midnights  Estimated Length of Stay: 3          B Medical/Surgery History Past Medical History:  Diagnosis Date   Sullivan Lone syndrome    Gout    Hypertension    Mild sleep apnea    Obesity (BMI 30-39.9)    Past Surgical History:  Procedure Laterality Date   CHOLECYSTECTOMY     HERNIA REPAIR       A IV Location/Drains/Wounds Patient Lines/Drains/Airways Status     Active Line/Drains/Airways     Name Placement date Placement time Site Days   Peripheral IV 08/05/22 20 G Anterior;Distal;Right;Upper Arm 08/05/22  1148  Arm  less than 1            Intake/Output Last 24 hours No intake or output data in the 24 hours ending 08/05/22  1511  Labs/Imaging Results for orders placed or performed during the hospital encounter of 08/05/22 (from the past 48 hour(s))  Urinalysis, Routine w reflex microscopic -Urine, Clean Catch     Status: Abnormal   Collection Time: 08/05/22 11:36 AM  Result Value Ref Range   Color, Urine YELLOW YELLOW   APPearance HAZY (A) CLEAR   Specific Gravity, Urine 1.025 1.005 - 1.030   pH 5.0 5.0 - 8.0   Glucose, UA >=500 (A) NEGATIVE mg/dL   Hgb urine dipstick NEGATIVE NEGATIVE   Bilirubin Urine NEGATIVE NEGATIVE   Ketones, ur 5 (A) NEGATIVE mg/dL   Protein, ur 30 (A) NEGATIVE mg/dL   Nitrite NEGATIVE NEGATIVE   Leukocytes,Ua NEGATIVE NEGATIVE   RBC / HPF 0-5 0 - 5 RBC/hpf   WBC, UA 0-5 0 - 5 WBC/hpf   Bacteria, UA FEW (A) NONE SEEN   Squamous Epithelial / HPF 0-5 0 - 5 /HPF   Mucus PRESENT     Comment: Performed at [facility], [address] [place], [place] [address]  CBC with Differential     Status: Abnormal   Collection Time: 08/05/22 11:44 AM  Result Value Ref Range   WBC 11.0 (H) 4.0 - 10.5 K/uL   RBC 5.02 4.22 - 5.81 MIL/uL   Hemoglobin 16.7 13.0 - 17.0 g/dL   HCT 82.9 56.2 - 13.0 %   MCV 91.6 80.0 - 100.0 fL   MCH 33.3 26.0 - 34.0 pg   MCHC 36.3 (  H) 30.0 - 36.0 g/dL   RDW 91.4 78.2 - 95.6 %   Platelets 151 150 - 400 K/uL   nRBC 0.0 0.0 - 0.2 %   Neutrophils Relative % 79 %   Neutro Abs 8.6 (H) 1.7 - 7.7 K/uL   Lymphocytes Relative 11 %   Lymphs Abs 1.3 0.7 - 4.0 K/uL   Monocytes Relative 9 %   Monocytes Absolute 1.0 0.1 - 1.0 K/uL   Eosinophils Relative 1 %   Eosinophils Absolute 0.1 0.0 - 0.5 K/uL   Basophils Relative 0 %   Basophils Absolute 0.0 0.0 - 0.1 K/uL   Immature Granulocytes 0 %   Abs Immature Granulocytes 0.03 0.00 - 0.07 K/uL    Comment: Performed at [facility], [address], [place], [place] [address]  Comprehensive metabolic panel     Status: Abnormal   Collection Time: 08/05/22 11:44 AM  Result Value Ref  Range   Sodium 128 (L) 135 - 145 mmol/L   Potassium 3.6 3.5 - 5.1 mmol/L   Chloride 93 (L) 98 - 111 mmol/L   CO2 24 22 - 32 mmol/L   Glucose, Bld 230 (H) 70 - 99 mg/dL    Comment: Glucose reference range applies only to samples taken after fasting for at least 8 hours.   BUN 13 6 - 20 mg/dL   Creatinine, Ser 6.57 0.61 - 1.24 mg/dL   Calcium 9.1 8.9 - 84.6 mg/dL   Total Protein 8.1 6.5 - 8.1 g/dL   Albumin 4.3 3.5 - 5.0 g/dL   AST 57 (H) 15 - 41 U/L   ALT 81 (H) 0 - 44 U/L   Alkaline Phosphatase 69 38 - 126 U/L   Total Bilirubin 3.1 (H) 0.3 - 1.2 mg/dL   GFR, Estimated >96 >29 mL/min    Comment: (NOTE) Calculated using the CKD-EPI Creatinine Equation (2021)    Anion gap 11 5 - 15    Comment: Performed at [facility], [address] [place], [place] [address]  Lipase, blood     Status: Abnormal   Collection Time: 08/05/22 11:44 AM  Result Value Ref Range   Lipase 1,106 (H) 11 - 51 U/L    Comment: RESULT CONFIRMED BY MANUAL DILUTION Performed at [facility], [address] [place], [place] [address]    CT ABDOMEN PELVIS W CONTRAST  Result Date: 08/05/2022 CLINICAL DATA:  Abdominal pain, acute, nonlocalized. EXAM: CT ABDOMEN AND PELVIS WITH CONTRAST TECHNIQUE: Multidetector CT imaging of the abdomen and pelvis was performed using the standard protocol following bolus administration of intravenous contrast. RADIATION DOSE REDUCTION: This exam was performed according to the departmental dose-optimization program which includes automated exposure control, adjustment of the mA and/or kV according to patient size and/or use of iterative reconstruction technique. CONTRAST:  OMNIPAQUE IOHEXOL 300 MG/ML  SOLN COMPARISON:  01/13/2020 FINDINGS: Lower chest: Dependent densities at both lung bases are suggestive for atelectasis. No significant pleural effusions. Hepatobiliary: Diffuse low-density in the liver is compatible with steatosis.  Cholecystectomy. No significant biliary dilatation. Main portal venous system is patent. Pancreas: Inflammatory changes centered around the distal pancreatic body and pancreatic tail region. Decreased and delayed enhancement in the distal pancreatic body and pancreatic tail raise concern for possible necrosis. Poorly defined area of low density in the distal pancreatic body measuring 1.6 cm image 27/2. No pancreatic duct dilatation. Spleen: Normal in size without focal abnormality. Adrenals/Urinary Tract: Normal adrenal glands. Normal urinary bladder. 2.4 cm  exophytic structure in the lateral left kidney is suggestive for a cyst. No hydronephrosis. No suspicious renal lesions. Small amount of fluid in the urinary bladder without gross abnormality. Stomach/Bowel: Normal appendix. Normal stomach. No bowel dilatation or obstruction. Vascular/Lymphatic: Portal venous system and splenic vein are patent. Aorta and main visceral arteries are patent. Atherosclerotic calcifications in the abdominal aorta without aneurysm. No significant lymph node enlargement in the abdomen or pelvis. IVC and renal veins are patent. Reproductive: Prostate is unremarkable. Other: Left inguinal hernia containing fat. Negative for ascites. Mild stranding in the left central abdominal mesentery. Musculoskeletal: No acute bone abnormality. IMPRESSION: 1. Inflammatory changes centered around the distal pancreatic body and pancreatic tail. Findings are most compatible with acute pancreatitis. Abnormal enhancement associated with these areas of pancreatic inflammation with a poorly defined low-density area in the distal pancreatic body. 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 Recommend follow-up imaging to exclude an underlying lesion or complications from pancreatic necrosis. 2. Hepatic steatosis. 3. Cholecystectomy. 4. Aortic Atherosclerosis (ICD10-I70.0). Electronically Signed   By: Richarda Overlie M.D.   On: 08/05/2022 14:36     Pending Labs Unresulted Labs (From admission, onward)     Start     Ordered   08/12/22 0500  Creatinine, serum  (enoxaparin (LOVENOX)    CrCl >/= 30 ml/min)  Weekly,   R     Comments: while on enoxaparin therapy    08/05/22 1445   08/06/22 0500  CBC  Daily,   R      08/05/22 1445   08/06/22 0500  Comprehensive metabolic panel  Daily,   R      08/05/22 1445   08/06/22 0500  Lipase, blood  Daily,   R      08/05/22 1453   08/05/22 1444  HIV Antibody (routine testing w rflx)  (HIV Antibody (Routine testing w reflex) panel)  Once,   R        08/05/22 1445   08/05/22 1444  CBC  (enoxaparin (LOVENOX)    CrCl >/= 30 ml/min)  Once,   R       Comments: Baseline for enoxaparin therapy IF NOT ALREADY DRAWN.  Notify MD if PLT < 100 K.    08/05/22 1445   08/05/22 1444  Creatinine, serum  (enoxaparin (LOVENOX)    CrCl >/= 30 ml/min)  Once,   R       Comments: Baseline for enoxaparin therapy IF NOT ALREADY DRAWN.    08/05/22 1445            Vitals/Pain Today's Vitals   08/05/22 1127 08/05/22 1131 08/05/22 1237 08/05/22 1431  BP: (!) 158/90   (!) 178/84  Pulse: 93   91  Resp: 18   17  Temp: 98.3 F (36.8 C)   98.6 F (37 C)  TempSrc: Oral   Oral  SpO2: 97%   96%  Weight:  117.9 kg    Height:  5\' 10"  (1.778 m)    PainSc:  7  5      Isolation Precautions No active isolations  Medications Medications  morphine (PF) 4 MG/ML injection 4 mg (has no administration in time range)  enoxaparin (LOVENOX) injection 60 mg (has no administration in time range)  traMADol (ULTRAM) tablet 50 mg (has no administration in time range)  HYDROmorphone (DILAUDID) injection 0.5 mg (has no administration in time range)  bisacodyl (DULCOLAX) EC tablet 5 mg (has no administration in time range)  polyethylene glycol (MIRALAX / GLYCOLAX) packet 17  g (has no administration in time range)  ondansetron (ZOFRAN) tablet 4 mg (has no administration in time range)    Or  ondansetron (ZOFRAN) injection 4  mg (has no administration in time range)  lactated ringers infusion (has no administration in time range)  morphine (PF) 4 MG/ML injection 4 mg (4 mg Intravenous Given 08/05/22 1157)  ondansetron (ZOFRAN) injection 4 mg (4 mg Intravenous Given 08/05/22 1157)  sodium chloride 0.9 % bolus 1,000 mL (0 mLs Intravenous Stopped 08/05/22 1237)  iohexol (OMNIPAQUE) 300 MG/ML solution 100 mL (100 mLs Intravenous Contrast Given 08/05/22 1351)    Mobility walks     Focused Assessments Cardiac Assessment Handoff:    Lab Results  Component Value Date   TROPONINI <0.30 10/01/2013   No results found for: "DDIMER" Does the Patient currently have chest pain? No    R Recommendations: See Admitting Provider Note  Report given to:   Additional Notes:

## 2022-08-05 NOTE — H&P (Signed)
History and Physical    [name]:[id number] DOB: [birth year] DOA: 08/05/2022  PCP: [facility]   Chief Complaint: Abdominal pain  HPI: Andrew Price is a 57 y.o. male with medical history significant of HTN, HLD. Occasional alcohol use with recent trip to the [place] where he admits to increased alcohol intake from baseline.  Patient has family history of pancreatic cancer in his mother which has caused him some added anxiety to his illness.  ED Course: In ED CT abdomen is consistent with pancreatitis, lipase elevated concurrently. Hospitalist called to admit  Review of Systems: As per HPI nausea and abdominal pain but denies headache, fevers, chills, chest pain or shortness of breath.   Assessment/Plan Principal Problem:   Acute pancreatitis    Acute pancreatitis, likely alcohol induced, POA -Likely in setting of recent vacation and increased alcohol intake -Currently n.p.o., allow ice chips overnight -Lipase elevated 1100, follow trend -Pain controlled currently with Dilaudid and tramadol. -Continue IV fluids  Hypertension -Continue lisinopril  Hyperlipidemia  -Hold gemfibrozil  DVT prophylaxis: Lovenox  Code Status: Full  Family Communication: None present  Status is: inpt  Dispo: The patient is from: home              Anticipated d/c is to: home              Anticipated d/c date is: 24-48h              Patient currently NOT medically stable for discharge  Consultants:  None  Procedures:  None   Past Medical History:  Diagnosis Date   Sullivan Lone syndrome    Gout    Hypertension    Mild sleep apnea    Obesity (BMI 30-39.9)     Past Surgical History:  Procedure Laterality Date   CHOLECYSTECTOMY     HERNIA REPAIR       reports that he has never smoked. He does not have any smokeless tobacco history on file. He reports current alcohol use. He reports that he does not use drugs.  Allergies  Allergen  Reactions   Flexeril [Cyclobenzaprine]     Hyperactive/Drowsiness    Family History  Problem Relation Age of Onset   Breast cancer Mother    Ovarian cancer Mother    Hypertension Father    Pulmonary fibrosis Father    Hyperlipidemia Brother     Prior to Admission medications   Medication Sig Start Date End Date Taking? Authorizing Provider  colchicine 0.6 MG tablet Take 0.6 mg by mouth daily as needed.    [provider]  indomethacin (INDOCIN) 50 MG capsule Take 50 mg by mouth as needed.    [provider]  lisinopril-hydrochlorothiazide (PRINZIDE,ZESTORETIC) 10-12.5 MG per tablet Take 1 tablet by mouth daily.    [provider]    Physical Exam: Vitals:   08/05/22 1127 08/05/22 1131 08/05/22 1431  BP: (!) 158/90  (!) 178/84  Pulse: 93  91  Resp: 18  17  Temp: 98.3 F (36.8 C)  98.6 F (37 C)  TempSrc: Oral  Oral  SpO2: 97%  96%  Weight:  117.9 kg   Height:  5\' 10"  (1.778 m)     Constitutional: NAD, calm, comfortable Vitals:   08/05/22 1127 08/05/22 1131 08/05/22 1431  BP: (!) 158/90  (!) 178/84  Pulse: 93  91  Resp: 18  17  Temp: 98.3 F (36.8 C)  98.6 F (37 C)  TempSrc: Oral  Oral  SpO2: 97%  96%  Weight:  117.9 kg   Height:  5\' 10"  (1.778 m)    General:  Pleasantly resting in bed, No acute distress. HEENT:  Normocephalic atraumatic.  Sclerae nonicteric, noninjected.  Extraocular movements intact bilaterally. Neck:  Without mass or deformity.  Trachea is midline. Lungs:  Clear to auscultate bilaterally without rhonchi, wheeze, or rales. Heart:  Regular rate and rhythm.  Without murmurs, rubs, or gallops. Abdomen:  Soft, nontender, nondistended.  Without guarding or rebound. Extremities: Without cyanosis, clubbing, edema, or obvious deformity. Vascular:  Dorsalis pedis and posterior tibial pulses palpable bilaterally. Skin:  Warm and dry, no erythema, no ulcerations.  Labs on Admission: I have personally reviewed following labs  and imaging studies  CBC: Recent Labs  Lab 08/05/22 1144  WBC 11.0*  NEUTROABS 8.6*  HGB 16.7  HCT 46.0  MCV 91.6  PLT 151   Basic Metabolic Panel: Recent Labs  Lab 08/05/22 1144  NA 128*  K 3.6  CL 93*  CO2 24  GLUCOSE 230*  BUN 13  CREATININE 0.80  CALCIUM 9.1   GFR: Estimated Creatinine Clearance: 131.1 mL/min (by C-G formula based on SCr of 0.8 mg/dL). Liver Function Tests: Recent Labs  Lab 08/05/22 1144  AST 57*  ALT 81*  ALKPHOS 69  BILITOT 3.1*  PROT 8.1  ALBUMIN 4.3   Recent Labs  Lab 08/05/22 1144  LIPASE 1,106*   No results for input(s): "AMMONIA" in the last 168 hours. Coagulation Profile: No results for input(s): "INR", "PROTIME" in the last 168 hours. Cardiac Enzymes: No results for input(s): "CKTOTAL", "CKMB", "CKMBINDEX", "TROPONINI" in the last 168 hours. BNP (last 3 results) No results for input(s): "PROBNP" in the last 8760 hours. HbA1C: No results for input(s): "HGBA1C" in the last 72 hours. CBG: No results for input(s): "GLUCAP" in the last 168 hours. Lipid Profile: No results for input(s): "CHOL", "HDL", "LDLCALC", "TRIG", "CHOLHDL", "LDLDIRECT" in the last 72 hours. Thyroid Function Tests: No results for input(s): "TSH", "T4TOTAL", "FREET4", "T3FREE", "THYROIDAB" in the last 72 hours. Anemia Panel: No results for input(s): "VITAMINB12", "FOLATE", "FERRITIN", "TIBC", "IRON", "RETICCTPCT" in the last 72 hours. Urine analysis:    Component Value Date/Time   COLORURINE YELLOW 08/05/2022 1136   APPEARANCEUR HAZY (A) 08/05/2022 1136   LABSPEC 1.025 08/05/2022 1136   PHURINE 5.0 08/05/2022 1136   GLUCOSEU >=500 (A) 08/05/2022 1136   HGBUR NEGATIVE 08/05/2022 1136   BILIRUBINUR NEGATIVE 08/05/2022 1136   KETONESUR 5 (A) 08/05/2022 1136   PROTEINUR 30 (A) 08/05/2022 1136   NITRITE NEGATIVE 08/05/2022 1136   LEUKOCYTESUR NEGATIVE 08/05/2022 1136    Radiological Exams on Admission: CT ABDOMEN PELVIS W CONTRAST  Result Date:  08/05/2022 CLINICAL DATA:  Abdominal pain, acute, nonlocalized. EXAM: CT ABDOMEN AND PELVIS WITH CONTRAST TECHNIQUE: Multidetector CT imaging of the abdomen and pelvis was performed using the standard protocol following bolus administration of intravenous contrast. RADIATION DOSE REDUCTION: This exam was performed according to the departmental dose-optimization program which includes automated exposure control, adjustment of the mA and/or kV according to patient size and/or use of iterative reconstruction technique. CONTRAST:  OMNIPAQUE IOHEXOL 300 MG/ML  SOLN COMPARISON:  01/13/2020 FINDINGS: Lower chest: Dependent densities at both lung bases are suggestive for atelectasis. No significant pleural effusions. Hepatobiliary: Diffuse low-density in the liver is compatible with steatosis. Cholecystectomy. No significant biliary dilatation. Main portal venous system is patent. Pancreas: Inflammatory changes centered around the distal pancreatic body and pancreatic tail region. Decreased and  delayed enhancement in the distal pancreatic body and pancreatic tail raise concern for possible necrosis. Poorly defined area of low density in the distal pancreatic body measuring 1.6 cm image 27/2. No pancreatic duct dilatation. Spleen: Normal in size without focal abnormality. Adrenals/Urinary Tract: Normal adrenal glands. Normal urinary bladder. 2.4 cm exophytic structure in the lateral left kidney is suggestive for a cyst. No hydronephrosis. No suspicious renal lesions. Small amount of fluid in the urinary bladder without gross abnormality. Stomach/Bowel: Normal appendix. Normal stomach. No bowel dilatation or obstruction. Vascular/Lymphatic: Portal venous system and splenic vein are patent. Aorta and main visceral arteries are patent. Atherosclerotic calcifications in the abdominal aorta without aneurysm. No significant lymph node enlargement in the abdomen or pelvis. IVC and renal veins are patent. Reproductive: Prostate  is unremarkable. Other: Left inguinal hernia containing fat. Negative for ascites. Mild stranding in the left central abdominal mesentery. Musculoskeletal: No acute bone abnormality. IMPRESSION: 1. Inflammatory changes centered around the distal pancreatic body and pancreatic tail. Findings are most compatible with acute pancreatitis. Abnormal enhancement associated with these areas of pancreatic inflammation with a poorly defined low-density area in the distal pancreatic body. 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 Recommend follow-up imaging to exclude an underlying lesion or complications from pancreatic necrosis. 2. Hepatic steatosis. 3. Cholecystectomy. 4. Aortic Atherosclerosis (ICD10-I70.0). Electronically Signed   By: Richarda Overlie M.D.   On: 08/05/2022 14:36    EKG: Independently reviewed.   Azucena Fallen DO [facility] For contact please use secure messenger on Epic  If 7PM-7AM, please contact night-coverage located on [email or web address]   08/05/2022, 2:45 PM

## 2022-08-05 NOTE — ED Provider Notes (Signed)
[place] EMERGENCY DEPARTMENT AT [facility] Provider Note   CSN: [id number] Arrival date & time: 08/05/22  1119     History Cholecystectomy Chief Complaint  Patient presents with   Abdominal Pain    Andrew Price is a 57 y.o. male.  57 y.o male with a PMH of cholecystectomy presents to the ED with a chief complaint of upper abdominal pain which has been ongoing since yesterday evening.  Evaluated at [facility] urgent care this morning and sent here to rule out acute abdomen.  Reports feeling his abdomen distended, swollen there is some epigastric pain, and sensation like when he had gallstones priorly.  He reports his last oral intake was around 6 PM last night.  He has had some nausea along with some dry heaving but has not had any vomiting.  His last bowel movement was yesterday.  His mother does have a prior history of pancreatic cancer.  He endorses recreational alcohol use.  No fever, no vomiting, no blood in his stool.  The history is provided by the patient.  Abdominal Pain Pain quality: bloating   Pain radiates to:  Does not radiate Associated symptoms: nausea   Associated symptoms: no chest pain, no chills, no fever, no shortness of breath, no sore throat and no vomiting        Home Medications Prior to Admission medications   Medication Sig Start Date End Date Taking? Authorizing Provider  colchicine 0.6 MG tablet Take 0.6 mg by mouth daily as needed.    [provider]  indomethacin (INDOCIN) 50 MG capsule Take 50 mg by mouth as needed.    [provider]  lisinopril-hydrochlorothiazide (PRINZIDE,ZESTORETIC) 10-12.5 MG per tablet Take 1 tablet by mouth daily.    [provider]      Allergies    Flexeril [cyclobenzaprine]    Review of Systems   Review of Systems  Constitutional:  Negative for chills and fever.  HENT:  Negative for sore throat.   Respiratory:  Negative for shortness of breath.   Cardiovascular:   Negative for chest pain.  Gastrointestinal:  Positive for abdominal pain and nausea. Negative for blood in stool and vomiting.  Genitourinary:  Negative for flank pain.  Musculoskeletal:  Negative for back pain.  All other systems reviewed and are negative.   Physical Exam Updated Vital Signs BP (!) 178/84 (BP Location: Left Arm)   Pulse 91   Temp 98.6 F (37 C) (Oral)   Resp 17   Ht 5\' 10"  (1.778 m)   Wt 117.9 kg   SpO2 96%   BMI 37.31 kg/m  Physical Exam Vitals and nursing note reviewed.  Constitutional:      Appearance: He is well-developed.  HENT:     Head: Normocephalic and atraumatic.  Cardiovascular:     Rate and Rhythm: Normal rate.  Pulmonary:     Effort: Pulmonary effort is normal.     Breath sounds: No wheezing or rales.  Abdominal:     General: Abdomen is flat. Bowel sounds are decreased.     Palpations: Abdomen is soft.     Tenderness: There is abdominal tenderness in the epigastric area and left upper quadrant. There is no guarding or rebound.  Skin:    General: Skin is warm and dry.  Neurological:     Mental Status: He is alert and oriented to person, place, and time.     ED Results / Procedures / Treatments   Labs (all labs ordered are  listed, but only abnormal results are displayed) Labs Reviewed  CBC WITH DIFFERENTIAL/PLATELET - Abnormal; Notable for the following components:      Result Value   WBC 11.0 (*)    MCHC 36.3 (*)    Neutro Abs 8.6 (*)    All other components within normal limits  COMPREHENSIVE METABOLIC PANEL - Abnormal; Notable for the following components:   Sodium 128 (*)    Chloride 93 (*)    Glucose, Bld 230 (*)    AST 57 (*)    ALT 81 (*)    Total Bilirubin 3.1 (*)    All other components within normal limits  LIPASE, BLOOD - Abnormal; Notable for the following components:   Lipase 1,106 (*)    All other components within normal limits  URINALYSIS, ROUTINE W REFLEX MICROSCOPIC - Abnormal; Notable for the following  components:   APPearance HAZY (*)    Glucose, UA >=500 (*)    Ketones, ur 5 (*)    Protein, ur 30 (*)    Bacteria, UA FEW (*)    All other components within normal limits  HIV ANTIBODY (ROUTINE TESTING W REFLEX)  CBC  CREATININE, SERUM    EKG None  Radiology CT ABDOMEN PELVIS W CONTRAST  Result Date: 08/05/2022 CLINICAL DATA:  Abdominal pain, acute, nonlocalized. EXAM: CT ABDOMEN AND PELVIS WITH CONTRAST TECHNIQUE: Multidetector CT imaging of the abdomen and pelvis was performed using the standard protocol following bolus administration of intravenous contrast. RADIATION DOSE REDUCTION: This exam was performed according to the departmental dose-optimization program which includes automated exposure control, adjustment of the mA and/or kV according to patient size and/or use of iterative reconstruction technique. CONTRAST:  OMNIPAQUE IOHEXOL 300 MG/ML  SOLN COMPARISON:  01/13/2020 FINDINGS: Lower chest: Dependent densities at both lung bases are suggestive for atelectasis. No significant pleural effusions. Hepatobiliary: Diffuse low-density in the liver is compatible with steatosis. Cholecystectomy. No significant biliary dilatation. Main portal venous system is patent. Pancreas: Inflammatory changes centered around the distal pancreatic body and pancreatic tail region. Decreased and delayed enhancement in the distal pancreatic body and pancreatic tail raise concern for possible necrosis. Poorly defined area of low density in the distal pancreatic body measuring 1.6 cm image 27/2. No pancreatic duct dilatation. Spleen: Normal in size without focal abnormality. Adrenals/Urinary Tract: Normal adrenal glands. Normal urinary bladder. 2.4 cm exophytic structure in the lateral left kidney is suggestive for a cyst. No hydronephrosis. No suspicious renal lesions. Small amount of fluid in the urinary bladder without gross abnormality. Stomach/Bowel: Normal appendix. Normal stomach. No bowel dilatation  or obstruction. Vascular/Lymphatic: Portal venous system and splenic vein are patent. Aorta and main visceral arteries are patent. Atherosclerotic calcifications in the abdominal aorta without aneurysm. No significant lymph node enlargement in the abdomen or pelvis. IVC and renal veins are patent. Reproductive: Prostate is unremarkable. Other: Left inguinal hernia containing fat. Negative for ascites. Mild stranding in the left central abdominal mesentery. Musculoskeletal: No acute bone abnormality. IMPRESSION: 1. Inflammatory changes centered around the distal pancreatic body and pancreatic tail. Findings are most compatible with acute pancreatitis. Abnormal enhancement associated with these areas of pancreatic inflammation with a poorly defined low-density area in the distal pancreatic body. 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 Recommend follow-up imaging to exclude an underlying lesion or complications from pancreatic necrosis. 2. Hepatic steatosis. 3. Cholecystectomy. 4. Aortic Atherosclerosis (ICD10-I70.0). Electronically Signed   By: Richarda Overlie M.D.   On: 08/05/2022 14:36    Procedures Procedures  Medications Ordered in ED Medications  morphine (PF) 4 MG/ML injection 4 mg (has no administration in time range)  enoxaparin (LOVENOX) injection 40 mg (has no administration in time range)  traMADol (ULTRAM) tablet 50 mg (has no administration in time range)  HYDROmorphone (DILAUDID) injection 0.5 mg (has no administration in time range)  bisacodyl (DULCOLAX) EC tablet 5 mg (has no administration in time range)  polyethylene glycol (MIRALAX / GLYCOLAX) packet 17 g (has no administration in time range)  ondansetron (ZOFRAN) tablet 4 mg (has no administration in time range)    Or  ondansetron (ZOFRAN) injection 4 mg (has no administration in time range)  morphine (PF) 4 MG/ML injection 4 mg (4 mg Intravenous Given 08/05/22 1157)  ondansetron (ZOFRAN) injection 4 mg (4 mg  Intravenous Given 08/05/22 1157)  sodium chloride 0.9 % bolus 1,000 mL (0 mLs Intravenous Stopped 08/05/22 1237)  iohexol (OMNIPAQUE) 300 MG/ML solution 100 mL (100 mLs Intravenous Contrast Given 08/05/22 1351)    ED Course/ Medical Decision Making/ A&P Clinical Course as of 08/05/22 1447  Sat Aug 05, 2022  1443 Lipase(!): 1,106 [JS]    Clinical Course User Index [JS] Claude Manges, PA-C                             Medical Decision Making Amount and/or Complexity of Data Reviewed Labs: ordered. Radiology: ordered.  Risk Prescription drug management.   This patient presents to the ED for concern of abdominal pain, this involves a number of treatment options, and is a complaint that carries with it a high risk of complications and morbidity.  The differential diagnosis includes pancreatitis, reflux, bowel obstruction versus Ileus.    Co morbidities: Discussed in HPI   Brief History:  See HPI.   EMR reviewed including pt PMHx, past surgical history and past visits to ER.   See HPI for more details   Lab Tests:  I ordered and independently interpreted labs.  The pertinent results include:    Labs notable for CBC with a slight leukocytosis of 11.  CMP with slight decrease in sodium.  Creatinine level is normal.  LFTs are slightly elevated, does have a prior history of cholecystectomy.  Lipase level is elevated at 1, 106 with no prior history of pancreatitis.  UA with glucose greater than 500, no nitrites or leukocytes to suggest infection.   Imaging Studies:  CT Abdomen and pelvis: 1. Inflammatory changes centered around the distal pancreatic body  and pancreatic tail. Findings are most compatible with acute  pancreatitis. Abnormal enhancement associated with these areas of  pancreatic inflammation with a poorly defined low-density area in  the distal pancreatic body. Findings concerning for changes of  pancreatic necrosis. Recommend follow-up imaging to exclude an   underlying lesion or complications from pancreatic necrosis.  2. Hepatic steatosis.  3. Cholecystectomy.  4. Aortic Atherosclerosis (ICD10-I70.0).   Medicines ordered:  I ordered medication including zofran,morphine  for symptomatic treatment  Reevaluation of the patient after these medicines showed that the patient improved I have reviewed the patients home medicines and have made adjustments as needed  Reevaluation:  After the interventions noted above I re-evaluated patient and found that they have :improved   Social Determinants of Health:  The patient's social determinants of health were a factor in the care of this patient  Problem List / ED Course:  Patient presents to the ED with a chief complaint of epigastric abdominal pain which  began yesterday, noted his abdomen to be swollen.  Went to [place] clinic, and was sent here to rule out acute abdomen.  Does not have a gallbladder, does report history of fatty liver.  No alcohol abuse, no recent medication changes.  Labs with a slight leukocytosis on his CBC, CMP without any electrolyte derangement.  Lipase level is over at thousand, no prior history of pancreatitis per patient.  CT abdomen and pelvis obtained which did not show acute pancreatitis.  He was given multiple rounds of morphine, bolus, Zofran to help with symptomatic treatment.  I discussed this case with Dr. Natale Milch of hospitalist service who will admit patient for further management.  Patient is hemodynamically stable for admission at this time.  Dispostion:  After consideration of the diagnostic results and the patients response to treatment, I feel that the patent would benefit from admission for treatment of pancreatitis.    Portions of this note were generated with Scientist, clinical (histocompatibility and immunogenetics). Dictation errors may occur despite best attempts at proofreading.   Final Clinical Impression(s) / ED Diagnoses Final diagnoses:  Acute pancreatitis, unspecified  complication status, unspecified pancreatitis type    Rx / DC Orders ED Discharge Orders     None         Claude Manges, PA-C 08/05/22 1447    Glendora Score, MD 08/05/22 1805

## 2022-08-06 DIAGNOSIS — K852 Alcohol induced acute pancreatitis without necrosis or infection: Secondary | ICD-10-CM | POA: Diagnosis not present

## 2022-08-06 LAB — CBC
HCT: 42.5 % (ref 39.0–52.0)
Hemoglobin: 14.8 g/dL (ref 13.0–17.0)
MCH: 32.7 pg (ref 26.0–34.0)
MCHC: 34.8 g/dL (ref 30.0–36.0)
MCV: 94 fL (ref 80.0–100.0)
Platelets: 134 10*3/uL — ABNORMAL LOW (ref 150–400)
RBC: 4.52 MIL/uL (ref 4.22–5.81)
RDW: 13.8 % (ref 11.5–15.5)
WBC: 12.4 10*3/uL — ABNORMAL HIGH (ref 4.0–10.5)
nRBC: 0 % (ref 0.0–0.2)

## 2022-08-06 LAB — HIV ANTIBODY (ROUTINE TESTING W REFLEX): HIV Screen 4th Generation wRfx: NONREACTIVE

## 2022-08-06 LAB — COMPREHENSIVE METABOLIC PANEL
ALT: 51 U/L — ABNORMAL HIGH (ref 0–44)
AST: 26 U/L (ref 15–41)
Albumin: 3.4 g/dL — ABNORMAL LOW (ref 3.5–5.0)
Alkaline Phosphatase: 42 U/L (ref 38–126)
Anion gap: 9 (ref 5–15)
BUN: 12 mg/dL (ref 6–20)
CO2: 26 mmol/L (ref 22–32)
Calcium: 8.3 mg/dL — ABNORMAL LOW (ref 8.9–10.3)
Chloride: 100 mmol/L (ref 98–111)
Creatinine, Ser: 0.98 mg/dL (ref 0.61–1.24)
GFR, Estimated: >60 mL/min (ref >60)
Glucose, Bld: 225 mg/dL — ABNORMAL HIGH (ref 70–99)
Potassium: 3.6 mmol/L (ref 3.5–5.1)
Sodium: 135 mmol/L (ref 135–145)
Total Bilirubin: 4.6 mg/dL — ABNORMAL HIGH (ref 0.3–1.2)
Total Protein: 6.8 g/dL (ref 6.5–8.1)

## 2022-08-06 LAB — GLUCOSE, CAPILLARY
Glucose-Capillary: 225 mg/dL — ABNORMAL HIGH (ref 70–99)
Glucose-Capillary: 236 mg/dL — ABNORMAL HIGH (ref 70–99)

## 2022-08-06 LAB — LIPASE, BLOOD: Lipase: 251 U/L — ABNORMAL HIGH (ref 11–51)

## 2022-08-06 MED ORDER — SIMETHICONE 80 MG PO CHEW
80.0000 mg | CHEWABLE_TABLET | Freq: Four times a day (QID) | ORAL | Status: DC | PRN
  Administered 2022-08-06 – 2022-08-08 (×6): 80 mg via ORAL
  Filled 2022-08-06 (×6): qty 1

## 2022-08-06 NOTE — Progress Notes (Signed)
  Transition of Care [facility]) Screening Note   Patient Details  Name: Andrew Price Date of Birth: [birth year]   Transition of Care [facility]) CM/SW Contact:    Adrian Prows, RN Phone Number: 08/06/2022, 10:25 AM    Transition of Care Department [facility]) has reviewed patient and no TOC needs have been identified at this time. We will continue to monitor patient advancement through interdisciplinary progression rounds. If new patient transition needs arise, please place a TOC consult.

## 2022-08-06 NOTE — Plan of Care (Signed)

## 2022-08-06 NOTE — Plan of Care (Signed)

## 2022-08-06 NOTE — Progress Notes (Signed)
PROGRESS NOTE    NICKOLOS LOUGHRAN  ZOX:[id number] DOB: [birth year] DOA: 08/05/2022 PCP: [facility]   Brief Narrative:  Andrew Price is a 57 y.o. male with medical history significant of HTN, HLD. Occasional alcohol use with recent trip to the [place] where he admits to increased alcohol intake from baseline.  Lipase elevated in ED, CT consistent with pancreatitis. Hospitalist called for admission.  Assessment & Plan:   Principal Problem:   Acute pancreatitis   Acute pancreatitis, likely alcohol induced, POA -Likely in setting of recent vacation and increased alcohol intake -Transition patient to clear liquid diet, poorly tolerated this morning, will recommend decreasing p.o. intake until symptoms improve. -Lipase elevated 1100, downtrending rapidly (250 today) -Pain controlled currently with Dilaudid and tramadol. -Continue IV fluids   Hypertension -Continue lisinopril   Hyperlipidemia  -Hold gemfibrozil  Chronic bilirubin elevation, stable -Continue to follow, likely not associated with above  DVT prophylaxis: Lovenox Code Status: Full Family Communication: Wife at bedside  Status is: Inpatient  Dispo: The patient is from: Home              Anticipated d/c is to: Home              Anticipated d/c date is: 24 to 48 hours              Patient currently not medically stable for discharge  Consultants:  None  Procedures:  None  Antimicrobials:  None  Subjective: No acute issues or events overnight denies chest pain shortness of breath headache fevers chills diarrhea constipation.  Transient episode of abdominal pain and nausea without vomiting this morning after attempted p.o. intake of clears  Objective: Vitals:   08/05/22 1640 08/05/22 2112 08/06/22 0037 08/06/22 0521  BP: (!) 158/88 137/81 130/71 113/78  Pulse: (!) 105 (!) 103 (!) 107 (!) 101  Resp: (!) 21 20 19 15   Temp: 98.9 F (37.2 C) 99.9 F (37.7 C) 99.2 F  (37.3 C) 98.2 F (36.8 C)  TempSrc: Oral Oral  Oral  SpO2: 96% 92% 92% 92%  Weight:      Height:        Intake/Output Summary (Last 24 hours) at 08/06/2022 0752 Last data filed at 08/06/2022 0344 Gross per 24 hour  Intake 1462.04 ml  Output --  Net 1462.04 ml   Filed Weights   08/05/22 1131  Weight: 117.9 kg    Examination:  General:  Pleasantly resting in bed, No acute distress. HEENT:  Normocephalic atraumatic.  Sclerae nonicteric, noninjected.  Extraocular movements intact bilaterally. Neck:  Without mass or deformity.  Trachea is midline. Lungs:  Clear to auscultate bilaterally without rhonchi, wheeze, or rales. Heart:  Regular rate and rhythm.  Without murmurs, rubs, or gallops. Abdomen:  Soft, diffusely tender PMI mid epigastrium without guarding or rebound. Extremities: Without cyanosis, clubbing, edema, or obvious deformity. Skin:  Warm and dry, no erythema  Data Reviewed: I have personally reviewed following labs and imaging studies  CBC: Recent Labs  Lab 08/05/22 1144 08/06/22 0445  WBC 11.0* 12.4*  NEUTROABS 8.6*  --   HGB 16.7 14.8  HCT 46.0 42.5  MCV 91.6 94.0  PLT 151 134*   Basic Metabolic Panel: Recent Labs  Lab 08/05/22 1144 08/06/22 0445  NA 128* 135  K 3.6 3.6  CL 93* 100  CO2 24 26  GLUCOSE 230* 225*  BUN 13 12  CREATININE 0.80 0.98  CALCIUM 9.1 8.3*   GFR: Estimated  Creatinine Clearance: 107 mL/min (by C-G formula based on SCr of 0.98 mg/dL). Liver Function Tests: Recent Labs  Lab 08/05/22 1144 08/06/22 0445  AST 57* 26  ALT 81* 51*  ALKPHOS 69 42  BILITOT 3.1* 4.6*  PROT 8.1 6.8  ALBUMIN 4.3 3.4*   Recent Labs  Lab 08/05/22 1144 08/06/22 0445  LIPASE 1,106* 251*   CBG: Recent Labs  Lab 08/05/22 1747  GLUCAP 193*    No results found for this or any previous visit (from the past 240 hour(s)).   Radiology Studies: CT ABDOMEN PELVIS W CONTRAST  Result Date: 08/05/2022 CLINICAL DATA:  Abdominal pain, acute,  nonlocalized. EXAM: CT ABDOMEN AND PELVIS WITH CONTRAST TECHNIQUE: Multidetector CT imaging of the abdomen and pelvis was performed using the standard protocol following bolus administration of intravenous contrast. RADIATION DOSE REDUCTION: This exam was performed according to the departmental dose-optimization program which includes automated exposure control, adjustment of the mA and/or kV according to patient size and/or use of iterative reconstruction technique. CONTRAST:  OMNIPAQUE IOHEXOL 300 MG/ML  SOLN COMPARISON:  01/13/2020 FINDINGS: Lower chest: Dependent densities at both lung bases are suggestive for atelectasis. No significant pleural effusions. Hepatobiliary: Diffuse low-density in the liver is compatible with steatosis. Cholecystectomy. No significant biliary dilatation. Main portal venous system is patent. Pancreas: Inflammatory changes centered around the distal pancreatic body and pancreatic tail region. Decreased and delayed enhancement in the distal pancreatic body and pancreatic tail raise concern for possible necrosis. Poorly defined area of low density in the distal pancreatic body measuring 1.6 cm image 27/2. No pancreatic duct dilatation. Spleen: Normal in size without focal abnormality. Adrenals/Urinary Tract: Normal adrenal glands. Normal urinary bladder. 2.4 cm exophytic structure in the lateral left kidney is suggestive for a cyst. No hydronephrosis. No suspicious renal lesions. Small amount of fluid in the urinary bladder without gross abnormality. Stomach/Bowel: Normal appendix. Normal stomach. No bowel dilatation or obstruction. Vascular/Lymphatic: Portal venous system and splenic vein are patent. Aorta and main visceral arteries are patent. Atherosclerotic calcifications in the abdominal aorta without aneurysm. No significant lymph node enlargement in the abdomen or pelvis. IVC and renal veins are patent. Reproductive: Prostate is unremarkable. Other: Left inguinal hernia  containing fat. Negative for ascites. Mild stranding in the left central abdominal mesentery. Musculoskeletal: No acute bone abnormality. IMPRESSION: 1. Inflammatory changes centered around the distal pancreatic body and pancreatic tail. Findings are most compatible with acute pancreatitis. Abnormal enhancement associated with these areas of pancreatic inflammation with a poorly defined low-density area in the distal pancreatic body. 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 Recommend follow-up imaging to exclude an underlying lesion or complications from pancreatic necrosis. 2. Hepatic steatosis. 3. Cholecystectomy. 4. Aortic Atherosclerosis (ICD10-I70.0). Electronically Signed   By: Richarda Overlie M.D.   On: 08/05/2022 14:36     Scheduled Meds:  enoxaparin (LOVENOX) injection  60 mg Subcutaneous Q24H   lisinopril  10 mg Oral Daily   Continuous Infusions:  lactated ringers 125 mL/hr at 08/06/22 0039     LOS: 1 day   Time spent:  Azucena Fallen, DO [facility]  If 7PM-7AM, please contact night-coverage [email or web address]  08/06/2022, 7:52 AM

## 2022-08-06 NOTE — Progress Notes (Signed)
  Transition of Care [facility]) Screening Note   Patient Details  Name: Andrew Price Date of Birth: [birth year]   Transition of Care [facility]) CM/SW Contact:    Adrian Prows, RN Phone Number: 08/06/2022, 10:17 AM    Transition of Care Department [facility]) has reviewed patient and no TOC needs have been identified at this time. We will continue to monitor patient advancement through interdisciplinary progression rounds. If new patient transition needs arise, please place a TOC consult.

## 2022-08-07 DIAGNOSIS — K852 Alcohol induced acute pancreatitis without necrosis or infection: Secondary | ICD-10-CM | POA: Diagnosis not present

## 2022-08-07 LAB — COMPREHENSIVE METABOLIC PANEL
ALT: 34 U/L (ref 0–44)
AST: 17 U/L (ref 15–41)
Albumin: 3 g/dL — ABNORMAL LOW (ref 3.5–5.0)
Alkaline Phosphatase: 42 U/L (ref 38–126)
Anion gap: 8 (ref 5–15)
BUN: 14 mg/dL (ref 6–20)
CO2: 27 mmol/L (ref 22–32)
Calcium: 7.8 mg/dL — ABNORMAL LOW (ref 8.9–10.3)
Chloride: 97 mmol/L — ABNORMAL LOW (ref 98–111)
Creatinine, Ser: 1.04 mg/dL (ref 0.61–1.24)
GFR, Estimated: >60 mL/min (ref >60)
Glucose, Bld: 208 mg/dL — ABNORMAL HIGH (ref 70–99)
Potassium: 3.6 mmol/L (ref 3.5–5.1)
Sodium: 132 mmol/L — ABNORMAL LOW (ref 135–145)
Total Bilirubin: 4.9 mg/dL — ABNORMAL HIGH (ref 0.3–1.2)
Total Protein: 6.5 g/dL (ref 6.5–8.1)

## 2022-08-07 LAB — CBC
HCT: 39.4 % (ref 39.0–52.0)
Hemoglobin: 13.7 g/dL (ref 13.0–17.0)
MCH: 33.9 pg (ref 26.0–34.0)
MCHC: 34.8 g/dL (ref 30.0–36.0)
MCV: 97.5 fL (ref 80.0–100.0)
Platelets: 121 10*3/uL — ABNORMAL LOW (ref 150–400)
RBC: 4.04 MIL/uL — ABNORMAL LOW (ref 4.22–5.81)
RDW: 14.4 % (ref 11.5–15.5)
WBC: 13.1 10*3/uL — ABNORMAL HIGH (ref 4.0–10.5)
nRBC: 0 % (ref 0.0–0.2)

## 2022-08-07 LAB — GLUCOSE, CAPILLARY: Glucose-Capillary: 211 mg/dL — ABNORMAL HIGH (ref 70–99)

## 2022-08-07 LAB — LIPASE, BLOOD: Lipase: 93 U/L — ABNORMAL HIGH (ref 11–51)

## 2022-08-07 MED ORDER — LIDOCAINE VISCOUS HCL 2 % MT SOLN
15.0000 mL | Freq: Once | OROMUCOSAL | Status: AC | PRN
  Administered 2022-08-07: 15 mL via OROMUCOSAL
  Filled 2022-08-07: qty 15

## 2022-08-07 MED ORDER — HYDROMORPHONE HCL 1 MG/ML IJ SOLN
0.5000 mg | INTRAMUSCULAR | Status: DC | PRN
  Administered 2022-08-07 – 2022-08-08 (×3): 0.5 mg via INTRAVENOUS
  Filled 2022-08-07 (×3): qty 0.5

## 2022-08-07 MED ORDER — POLYETHYLENE GLYCOL 3350 17 G PO PACK
17.0000 g | PACK | Freq: Two times a day (BID) | ORAL | Status: DC
  Administered 2022-08-07 – 2022-08-08 (×3): 17 g via ORAL
  Filled 2022-08-07 (×3): qty 1

## 2022-08-07 NOTE — Progress Notes (Signed)
PROGRESS NOTE    Andrew KERKSTRA  Price:[id number] DOB: 09/19/1965 DOA: 08/05/2022 PCP: [facility]   Brief Narrative:  Andrew Price is a 57 y.o. male with medical history significant of HTN, HLD. Occasional alcohol use with recent trip to the [place] where he admits to increased alcohol intake from baseline.  Lipase elevated in ED, CT consistent with pancreatitis. Hospitalist called for admission.  Assessment & Plan:   Principal Problem:   Acute pancreatitis  Acute pancreatitis, likely alcohol induced, POA -Likely in setting of recent vacation and increased alcohol intake -continue full liquid diet - tolerating poorly yesterday -Fevers overnight, likely secondary to pain -Lipase elevated 1100 at intake, downtrending rapidly (250 -->90) -Pain controlled currently with Dilaudid and tramadol. -Continue IV fluids   Hypertension -Continue lisinopril   Hyperlipidemia  -Hold gemfibrozil  Chronic bilirubin elevation, stable -Continue to follow, likely not associated with above  DVT prophylaxis: Lovenox Code Status: Full Family Communication: Wife at bedside  Status is: Inpatient  Dispo: The patient is from: Home              Anticipated d/c is to: Home              Anticipated d/c date is: 24 to 48 hours              Patient currently not medically stable for discharge  Consultants:  None  Procedures:  None  Antimicrobials:  None  Subjective: No acute issues or events overnight denies chest pain shortness of breath headache fevers chills diarrhea constipation.  Fevers recorded overnight but patient denies any symptoms of fever/chills  Objective: Vitals:   08/06/22 2200 08/06/22 2300 08/07/22 0000 08/07/22 0616  BP:    137/87  Pulse:    98  Resp: 17 17 18 17   Temp:  99.5 F (37.5 C) 98.7 F (37.1 C) (!) 101.4 F (38.6 C)  TempSrc:  Oral Oral Oral  SpO2:    97%  Weight:      Height:        Intake/Output Summary (Last  24 hours) at 08/07/2022 0817 Last data filed at 08/07/2022 0600 Gross per 24 hour  Intake 3487.37 ml  Output --  Net 3487.37 ml    Filed Weights   08/05/22 1131  Weight: 117.9 kg    Examination:  General:  Pleasantly resting in bed, No acute distress. HEENT:  Normocephalic atraumatic.  Sclerae nonicteric, noninjected.  Extraocular movements intact bilaterally. Neck:  Without mass or deformity.  Trachea is midline. Lungs:  Clear to auscultate bilaterally without rhonchi, wheeze, or rales. Heart:  Regular rate and rhythm.  Without murmurs, rubs, or gallops. Abdomen:  Soft, diffusely tender PMI mid epigastrium without guarding or rebound. Extremities: Without cyanosis, clubbing, edema, or obvious deformity. Skin:  Warm and dry, no erythema  Data Reviewed: I have personally reviewed following labs and imaging studies  CBC: Recent Labs  Lab 08/05/22 1144 08/06/22 0445 08/07/22 0432  WBC 11.0* 12.4* 13.1*  NEUTROABS 8.6*  --   --   HGB 16.7 14.8 13.7  HCT 46.0 42.5 39.4  MCV 91.6 94.0 97.5  PLT 151 134* 121*    Basic Metabolic Panel: Recent Labs  Lab 08/05/22 1144 08/06/22 0445 08/07/22 0432  NA 128* 135 132*  K 3.6 3.6 3.6  CL 93* 100 97*  CO2 24 26 27   GLUCOSE 230* 225* 208*  BUN 13 12 14   CREATININE 0.80 0.98 1.04  CALCIUM 9.1 8.3* 7.8*  GFR: Estimated Creatinine Clearance: 100.9 mL/min (by C-G formula based on SCr of 1.04 mg/dL). Liver Function Tests: Recent Labs  Lab 08/05/22 1144 08/06/22 0445 08/07/22 0432  AST 57* 26 17  ALT 81* 51* 34  ALKPHOS 69 42 42  BILITOT 3.1* 4.6* 4.9*  PROT 8.1 6.8 6.5  ALBUMIN 4.3 3.4* 3.0*    Recent Labs  Lab 08/05/22 1144 08/06/22 0445 08/07/22 0432  LIPASE 1,106* 251* 93*    CBG: Recent Labs  Lab 08/05/22 1747 08/06/22 1917 08/06/22 2355 08/07/22 0613  GLUCAP 193* 225* 236* 211*     No results found for this or any previous visit (from the past 240 hour(s)).   Radiology Studies: CT ABDOMEN  PELVIS W CONTRAST  Result Date: 08/05/2022 CLINICAL DATA:  Abdominal pain, acute, nonlocalized. EXAM: CT ABDOMEN AND PELVIS WITH CONTRAST TECHNIQUE: Multidetector CT imaging of the abdomen and pelvis was performed using the standard protocol following bolus administration of intravenous contrast. RADIATION DOSE REDUCTION: This exam was performed according to the departmental dose-optimization program which includes automated exposure control, adjustment of the mA and/or kV according to patient size and/or use of iterative reconstruction technique. CONTRAST:  OMNIPAQUE IOHEXOL 300 MG/ML  SOLN COMPARISON:  01/13/2020 FINDINGS: Lower chest: Dependent densities at both lung bases are suggestive for atelectasis. No significant pleural effusions. Hepatobiliary: Diffuse low-density in the liver is compatible with steatosis. Cholecystectomy. No significant biliary dilatation. Main portal venous system is patent. Pancreas: Inflammatory changes centered around the distal pancreatic body and pancreatic tail region. Decreased and delayed enhancement in the distal pancreatic body and pancreatic tail raise concern for possible necrosis. Poorly defined area of low density in the distal pancreatic body measuring 1.6 cm image 27/2. No pancreatic duct dilatation. Spleen: Normal in size without focal abnormality. Adrenals/Urinary Tract: Normal adrenal glands. Normal urinary bladder. 2.4 cm exophytic structure in the lateral left kidney is suggestive for a cyst. No hydronephrosis. No suspicious renal lesions. Small amount of fluid in the urinary bladder without gross abnormality. Stomach/Bowel: Normal appendix. Normal stomach. No bowel dilatation or obstruction. Vascular/Lymphatic: Portal venous system and splenic vein are patent. Aorta and main visceral arteries are patent. Atherosclerotic calcifications in the abdominal aorta without aneurysm. No significant lymph node enlargement in the abdomen or pelvis. IVC and renal veins  are patent. Reproductive: Prostate is unremarkable. Other: Left inguinal hernia containing fat. Negative for ascites. Mild stranding in the left central abdominal mesentery. Musculoskeletal: No acute bone abnormality. IMPRESSION: 1. Inflammatory changes centered around the distal pancreatic body and pancreatic tail. Findings are most compatible with acute pancreatitis. Abnormal enhancement associated with these areas of pancreatic inflammation with a poorly defined low-density area in the distal pancreatic body. 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 Recommend follow-up imaging to exclude an underlying lesion or complications from pancreatic necrosis. 2. Hepatic steatosis. 3. Cholecystectomy. 4. Aortic Atherosclerosis (ICD10-I70.0). Electronically Signed   By: Richarda Overlie M.D.   On: 08/05/2022 14:36     Scheduled Meds:  enoxaparin (LOVENOX) injection  60 mg Subcutaneous Q24H   lisinopril  10 mg Oral Daily   Continuous Infusions:  lactated ringers 125 mL/hr at 08/07/22 0600     LOS: 2 days   Time spent:  Azucena Fallen, DO [facility]  If 7PM-7AM, please contact night-coverage [email or web address]  08/07/2022, 8:17 AM

## 2022-08-07 NOTE — Inpatient Diabetes Management (Signed)
Inpatient Diabetes Program Recommendations  AACE/ADA: New Consensus Statement on Inpatient Glycemic Control (2015)  Target Ranges:  Prepandial:   less than 140 mg/dL      Peak postprandial:   less than 180 mg/dL (1-2 hours)      Critically ill patients:  140 - 180 mg/dL   Lab Results  Component Value Date   GLUCAP 211 (H) 08/07/2022    Review of Glycemic Control  Latest Reference Range & Units 08/06/22 19:17 08/06/22 23:55 08/07/22 06:13  Glucose-Capillary 70 - 99 mg/dL 161 (H) 096 (H) 045 (H)  (H): Data is abnormally high Diabetes history: No DM hx noted Outpatient Diabetes medications: none Current orders for Inpatient glycemic control: none  Inpatient Diabetes Program Recommendations:    Given current trends consider: -Adding A1C?  -Adding Novolog 0-9 units TID & Hs  Thanks, Lujean Rave, MSN, RNC-OB Diabetes Coordinator [phone] (8a-5p)

## 2022-08-08 DIAGNOSIS — K852 Alcohol induced acute pancreatitis without necrosis or infection: Secondary | ICD-10-CM | POA: Diagnosis not present

## 2022-08-08 LAB — COMPREHENSIVE METABOLIC PANEL
ALT: 31 U/L (ref 0–44)
AST: 26 U/L (ref 15–41)
Albumin: 2.9 g/dL — ABNORMAL LOW (ref 3.5–5.0)
Alkaline Phosphatase: 39 U/L (ref 38–126)
Anion gap: 8 (ref 5–15)
BUN: 8 mg/dL (ref 6–20)
CO2: 28 mmol/L (ref 22–32)
Calcium: 7.4 mg/dL — ABNORMAL LOW (ref 8.9–10.3)
Chloride: 93 mmol/L — ABNORMAL LOW (ref 98–111)
Creatinine, Ser: 1.01 mg/dL (ref 0.61–1.24)
GFR, Estimated: >60 mL/min (ref >60)
Glucose, Bld: 242 mg/dL — ABNORMAL HIGH (ref 70–99)
Potassium: 3.4 mmol/L — ABNORMAL LOW (ref 3.5–5.1)
Sodium: 129 mmol/L — ABNORMAL LOW (ref 135–145)
Total Bilirubin: 4 mg/dL — ABNORMAL HIGH (ref 0.3–1.2)
Total Protein: 6.2 g/dL — ABNORMAL LOW (ref 6.5–8.1)

## 2022-08-08 LAB — CBC
HCT: 35.8 % — ABNORMAL LOW (ref 39.0–52.0)
Hemoglobin: 12.4 g/dL — ABNORMAL LOW (ref 13.0–17.0)
MCH: 33.3 pg (ref 26.0–34.0)
MCHC: 34.6 g/dL (ref 30.0–36.0)
MCV: 96.2 fL (ref 80.0–100.0)
Platelets: 120 10*3/uL — ABNORMAL LOW (ref 150–400)
RBC: 3.72 MIL/uL — ABNORMAL LOW (ref 4.22–5.81)
RDW: 14.1 % (ref 11.5–15.5)
WBC: 10.2 10*3/uL (ref 4.0–10.5)
nRBC: 0 % (ref 0.0–0.2)

## 2022-08-08 LAB — LIPASE, BLOOD: Lipase: 48 U/L (ref 11–51)

## 2022-08-08 MED ORDER — SIMETHICONE 80 MG PO CHEW: 80.0000 mg | CHEWABLE_TABLET | Freq: Four times a day (QID) | ORAL | 0 refills | Status: DC | PRN

## 2022-08-08 MED ORDER — HYDROCODONE-ACETAMINOPHEN 5-325 MG PO TABS: 1.0000 | ORAL_TABLET | ORAL | 0 refills | Status: DC | PRN

## 2022-08-08 MED ORDER — LIDOCAINE VISCOUS HCL 2 % MT SOLN
15.0000 mL | Freq: Once | OROMUCOSAL | Status: AC | PRN
  Administered 2022-08-08: 15 mL via OROMUCOSAL
  Filled 2022-08-08: qty 15

## 2022-08-08 NOTE — Plan of Care (Signed)

## 2022-08-08 NOTE — Progress Notes (Signed)
   08/07/22 2011  Assess: MEWS Score  Temp (!) 102.8 F (39.3 C)  BP (!) 159/92  MAP (mmHg) 111  Pulse Rate (!) 104  Resp 20  Level of Consciousness Alert  SpO2 95 %  O2 Device Room Air  Assess: MEWS Score  MEWS Temp 2  MEWS Systolic 0  MEWS Pulse 1  MEWS RR 0  MEWS LOC 0  MEWS Score 3  MEWS Score Color Yellow  Assess: if the MEWS score is Yellow or Red  Were vital signs taken at a resting state? Yes  Focused Assessment No change from prior assessment  Does the patient meet 2 or more of the SIRS criteria? Yes  Does the patient have a confirmed or suspected source of infection? No  MEWS guidelines implemented  No, previously yellow, continue vital signs every 4 hours  Notify: Charge Nurse/RN  Name of Charge Nurse/RN Notified Psychologist, educational, RN  Provider Notification  Provider Name/Title J. Garner Nash, NP  Date Provider Notified 08/07/22  Time Provider Notified 2018  Method of Notification Page  Notification Reason Other (Comment) (temp 102.8)  Provider response See new orders  Date of Provider Response 08/07/22  Assess: SIRS CRITERIA  SIRS Temperature  1  SIRS Pulse 1  SIRS Respirations  0  SIRS WBC 0  SIRS Score Sum  2

## 2022-08-08 NOTE — Progress Notes (Signed)
Pt is discharging home with no needs. Pt is alert and oriented. Pt wife at the bedside to transport pt home. AVS was given and explained. Pt IV was discontinued. Belongings were packed and sent home with the patient. NT to transport pt to main entrance via wheelchair for discharge.

## 2022-08-08 NOTE — Discharge Summary (Signed)
Physician Discharge Summary  Andrew Price GNF:[id number] DOB: 03-08-1966 DOA: 08/05/2022  PCP: Sheliah Hatch, PA-C  Admit date: 08/05/2022 Discharge date: 08/08/2022  Admitted From: Home Disposition: Home  Recommendations for Outpatient Follow-up:  Follow up with PCP in 1-2 weeks  Home Health: None Equipment/Devices: None  Discharge Condition: Stable CODE STATUS: Full Diet recommendation: Bland soft diet, avoid fatty foods and alcohol  Brief/Interim Summary: Andrew Price is a 57 y.o. male with medical history significant of HTN, HLD. Occasional alcohol use with recent trip to the [place] where he admits to increased alcohol intake from baseline.  Lipase elevated in ED, CT consistent with pancreatitis. Hospitalist called for admission.   Patient admitted as above with acute alcohol induced pancreatitis.  This is his first episode.  He has no history of pancreatitis or pancreatic issues.  Patient was continued on IV fluid with slow advancement of diet.  Now tolerating p.o. quite well without further incident or issue.  Will discharge patient on analgesics as below, recommend close follow-up with PCP in the next 1 to 2 weeks.  Lengthy discussion about diet and alcohol cessation.  Family at bedside.  Otherwise stable and agreeable for discharge home  Discharge Diagnoses:  Principal Problem:   Acute pancreatitis  Acute pancreatitis, likely alcohol induced, POA -Likely in setting of recent vacation and increased alcohol intake -Transient fevers overnight, correlate with pain -Lipase elevated 1100 at intake, downtrending rapidly (250 -->40)  Hypertension -Continue lisinopril   Hyperlipidemia  -Resume gemfibrozil   Chronic bilirubin elevation, stable -Continue to follow, likely not associated with above  Discharge Instructions   Allergies as of 08/08/2022       Reactions   Flexeril [cyclobenzaprine]    Hyperactive/Drowsiness   Sildenafil    Other  Reaction(s): nasal congestion        Medication List     STOP taking these medications    naproxen sodium 220 MG tablet Commonly known as: ALEVE       TAKE these medications    colchicine 0.6 MG tablet Take 0.6 mg by mouth daily as needed (gout).   gemfibrozil 600 MG tablet Commonly known as: LOPID Take 600 mg by mouth daily.   HYDROcodone-acetaminophen 5-325 MG tablet Commonly known as: NORCO/VICODIN Take 1 tablet by mouth every 4 (four) hours as needed for moderate pain.   lisinopril 10 MG tablet Commonly known as: ZESTRIL Take 10 mg by mouth daily.   loratadine 10 MG tablet Commonly known as: CLARITIN Take 10 mg by mouth daily as needed for allergies.   multivitamin with minerals tablet Take 1 tablet by mouth daily.   simethicone 80 MG chewable tablet Commonly known as: MYLICON Chew 1 tablet (80 mg total) by mouth 4 (four) times daily as needed for flatulence.        Allergies  Allergen Reactions   Flexeril [Cyclobenzaprine]     Hyperactive/Drowsiness   Sildenafil     Other Reaction(s): nasal congestion    Consultations: None  Procedures/Studies: CT ABDOMEN PELVIS W CONTRAST  Result Date: 08/05/2022 CLINICAL DATA:  Abdominal pain, acute, nonlocalized. EXAM: CT ABDOMEN AND PELVIS WITH CONTRAST TECHNIQUE: Multidetector CT imaging of the abdomen and pelvis was performed using the standard protocol following bolus administration of intravenous contrast. RADIATION DOSE REDUCTION: This exam was performed according to the departmental dose-optimization program which includes automated exposure control, adjustment of the mA and/or kV according to patient size and/or use of iterative reconstruction technique. CONTRAST:  OMNIPAQUE IOHEXOL 300 MG/ML  SOLN COMPARISON:  01/13/2020 FINDINGS: Lower chest: Dependent densities at both lung bases are suggestive for atelectasis. No significant pleural effusions. Hepatobiliary: Diffuse low-density in the liver is  compatible with steatosis. Cholecystectomy. No significant biliary dilatation. Main portal venous system is patent. Pancreas: Inflammatory changes centered around the distal pancreatic body and pancreatic tail region. Decreased and delayed enhancement in the distal pancreatic body and pancreatic tail raise concern for possible necrosis. Poorly defined area of low density in the distal pancreatic body measuring 1.6 cm image 27/2. No pancreatic duct dilatation. Spleen: Normal in size without focal abnormality. Adrenals/Urinary Tract: Normal adrenal glands. Normal urinary bladder. 2.4 cm exophytic structure in the lateral left kidney is suggestive for a cyst. No hydronephrosis. No suspicious renal lesions. Small amount of fluid in the urinary bladder without gross abnormality. Stomach/Bowel: Normal appendix. Normal stomach. No bowel dilatation or obstruction. Vascular/Lymphatic: Portal venous system and splenic vein are patent. Aorta and main visceral arteries are patent. Atherosclerotic calcifications in the abdominal aorta without aneurysm. No significant lymph node enlargement in the abdomen or pelvis. IVC and renal veins are patent. Reproductive: Prostate is unremarkable. Other: Left inguinal hernia containing fat. Negative for ascites. Mild stranding in the left central abdominal mesentery. Musculoskeletal: No acute bone abnormality. IMPRESSION: 1. Inflammatory changes centered around the distal pancreatic body and pancreatic tail. Findings are most compatible with acute pancreatitis. Abnormal enhancement associated with these areas of pancreatic inflammation with a poorly defined low-density area in the distal pancreatic body. 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 Recommend follow-up imaging to exclude an underlying lesion or complications from pancreatic necrosis. 2. Hepatic steatosis. 3. Cholecystectomy. 4. Aortic Atherosclerosis (ICD10-I70.0). Electronically Signed   By: Richarda Overlie M.D.   On:  08/05/2022 14:36     Subjective: No acute issues or events overnight denies nausea vomiting diarrhea constipation headache or chest pain   Discharge Exam: Vitals:   08/08/22 0426 08/08/22 0824  BP: (!) 153/93 (!) 146/89  Pulse: (!) 102 90  Resp:  18  Temp: 99.5 F (37.5 C) 98.5 F (36.9 C)  SpO2: 91% 93%   Vitals:   08/07/22 2011 08/08/22 0022 08/08/22 0426 08/08/22 0824  BP: (!) 159/92 (!) 149/82 (!) 153/93 (!) 146/89  Pulse: (!) 104 (!) 102 (!) 102 90  Resp: 20 18  18   Temp: (!) 102.8 F (39.3 C) 100.1 F (37.8 C) 99.5 F (37.5 C) 98.5 F (36.9 C)  TempSrc: Oral Oral Oral Oral  SpO2: 95% 91% 91% 93%  Weight:      Height:        General: Pt is alert, awake, not in acute distress Cardiovascular: RRR, S1/S2 +, no rubs, no gallops Respiratory: CTA bilaterally, no wheezing, no rhonchi Abdominal: Soft, NT, ND, bowel sounds + Extremities: no edema, no cyanosis    The results of significant diagnostics from this hospitalization (including imaging, microbiology, ancillary and laboratory) are listed below for reference.     Microbiology: No results found for this or any previous visit (from the past 240 hour(s)).   Labs: BNP (last 3 results) No results for input(s): "BNP" in the last 8760 hours. Basic Metabolic Panel: Recent Labs  Lab 08/05/22 1144 08/06/22 0445 08/07/22 0432 08/08/22 0342  NA 128* 135 132* 129*  K 3.6 3.6 3.6 3.4*  CL 93* 100 97* 93*  CO2 24 26 27 28   GLUCOSE 230* 225* 208* 242*  BUN 13 12 14 8   CREATININE 0.80 0.98 1.04 1.01  CALCIUM 9.1 8.3* 7.8* 7.4*  Liver Function Tests: Recent Labs  Lab 08/05/22 1144 08/06/22 0445 08/07/22 0432 08/08/22 0342  AST 57* 26 17 26   ALT 81* 51* 34 31  ALKPHOS 69 42 42 39  BILITOT 3.1* 4.6* 4.9* 4.0*  PROT 8.1 6.8 6.5 6.2*  ALBUMIN 4.3 3.4* 3.0* 2.9*   Recent Labs  Lab 08/05/22 1144 08/06/22 0445 08/07/22 0432 08/08/22 0342  LIPASE 1,106* 251* 93* 48   No results for input(s): "AMMONIA"  in the last 168 hours. CBC: Recent Labs  Lab 08/05/22 1144 08/06/22 0445 08/07/22 0432 08/08/22 0342  WBC 11.0* 12.4* 13.1* 10.2  NEUTROABS 8.6*  --   --   --   HGB 16.7 14.8 13.7 12.4*  HCT 46.0 42.5 39.4 35.8*  MCV 91.6 94.0 97.5 96.2  PLT 151 134* 121* 120*   Cardiac Enzymes: No results for input(s): "CKTOTAL", "CKMB", "CKMBINDEX", "TROPONINI" in the last 168 hours. BNP: Invalid input(s): "POCBNP" CBG: Recent Labs  Lab 08/05/22 1747 08/06/22 1917 08/06/22 2355 08/07/22 0613  GLUCAP 193* 225* 236* 211*   D-Dimer No results for input(s): "DDIMER" in the last 72 hours. Hgb A1c No results for input(s): "HGBA1C" in the last 72 hours. Lipid Profile No results for input(s): "CHOL", "HDL", "LDLCALC", "TRIG", "CHOLHDL", "LDLDIRECT" in the last 72 hours. Thyroid function studies No results for input(s): "TSH", "T4TOTAL", "T3FREE", "THYROIDAB" in the last 72 hours.  Invalid input(s): "FREET3" Anemia work up No results for input(s): "VITAMINB12", "FOLATE", "FERRITIN", "TIBC", "IRON", "RETICCTPCT" in the last 72 hours. Urinalysis    Component Value Date/Time   COLORURINE YELLOW 08/05/2022 1136   APPEARANCEUR HAZY (A) 08/05/2022 1136   LABSPEC 1.025 08/05/2022 1136   PHURINE 5.0 08/05/2022 1136   GLUCOSEU >=500 (A) 08/05/2022 1136   HGBUR NEGATIVE 08/05/2022 1136   BILIRUBINUR NEGATIVE 08/05/2022 1136   KETONESUR 5 (A) 08/05/2022 1136   PROTEINUR 30 (A) 08/05/2022 1136   NITRITE NEGATIVE 08/05/2022 1136   LEUKOCYTESUR NEGATIVE 08/05/2022 1136   Sepsis Labs Recent Labs  Lab 08/05/22 1144 08/06/22 0445 08/07/22 0432 08/08/22 0342  WBC 11.0* 12.4* 13.1* 10.2   Microbiology No results found for this or any previous visit (from the past 240 hour(s)).   Time coordinating discharge: Over 30 minutes  SIGNED:   Azucena Fallen, DO [facility] 08/08/2022, 11:48 AM Pager   If 7PM-7AM, please contact night-coverage [email or web address]

## 2022-08-08 NOTE — Progress Notes (Signed)
Beginning of shift patient c/o of hiccups causing abdominal pain 8/10. Encouraged patient to try non-pharmacological methods. Patient continued to have them with abd pain. Notified J. Garner Nash, NP. New order for one-time dose lidocaine PO elixir ordered and given. Will continue to monitor.

## 2022-08-08 NOTE — Progress Notes (Addendum)
Temp 102.8. Notified Johann Capers, NP. Administered 325 mg PO tylenol and placed ice packs under arms/groin. Will continue to monitor, do VSs q4h & help control pain.

## 2022-09-18 ENCOUNTER — Encounter: Payer: Self-pay | Admitting: Family Medicine

## 2022-09-18 ENCOUNTER — Other Ambulatory Visit: Payer: Self-pay | Admitting: Family Medicine

## 2022-09-18 DIAGNOSIS — K859 Acute pancreatitis without necrosis or infection, unspecified: Secondary | ICD-10-CM

## 2022-09-27 ENCOUNTER — Ambulatory Visit
Admission: RE | Admit: 2022-09-27 | Discharge: 2022-09-27 | Disposition: A | Discharge status: Home / Self Care | Payer: 59 | Source: Ambulatory Visit | Attending: Family Medicine | Admitting: Family Medicine

## 2022-09-27 DIAGNOSIS — K859 Acute pancreatitis without necrosis or infection, unspecified: Secondary | ICD-10-CM

## 2022-09-27 MED ORDER — GADOPICLENOL 0.5 MMOL/ML IV SOLN
10.0000 mL | Freq: Once | INTRAVENOUS | Status: AC | PRN
  Administered 2022-09-27: 10 mL via INTRAVENOUS

## 2022-12-07 DIAGNOSIS — R0602 Shortness of breath: Secondary | ICD-10-CM | POA: Insufficient documentation

## 2022-12-07 NOTE — Progress Notes (Signed)
Cardiology Office Note   Date:  12/08/2022   ID:  AIYDAN STRABALA, DOB [birth year], MRN [id number]  PCP:  Sheliah Hatch, PA-C  Cardiologist:   Rollene Rotunda, MD Referring:  Sheliah Hatch, PA-C  Chief Complaint  Patient presents with   Chest Pain      History of Present Illness: Andrew Price is a 57 y.o. male who presents for evaluation of SOB.  He is referred by Sheliah Hatch, PA-C .  He has no prior cardiac history.  He does have borderline blood sugars.  He has hypertriglyceridemia.  He was in the hospital in May with pancreatitis.  He has not had any prior cardiac workup other than I saw an echo years ago in 2014.  He does not really recall the details of this but I do see that he had normal left ventricular ejection fraction and some borderline LVH.  There was trivial mitral and tricuspid regurgitation.  He had COVID about 3 weeks ago.  Following this he has had tightness in his upper chest and through his jaw.  This is sporadic.  It comes on at rest.  It also happens while he is riding his electric bike.  He says he can keep riding the bike and it will go away but it will come on and can be 6 out of 10 in intensity.  He gets a little nauseated.  He does not have any bad diaphoresis and has not had any significant shortness of breath though he feels like he has to take a deep breath sometimes when this happens.  He had 1 episode of dizziness and sweating unrelated to this.  He otherwise does not feel tachypalpitations.  He said no presyncope or syncope.    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     gemfibrozil (LOPID) 600  MG tablet Take 600 mg by mouth daily.     lisinopril (ZESTRIL) 10 MG tablet Take 10 mg by mouth daily.     loratadine (CLARITIN) 10 MG tablet Take 10 mg by mouth daily as needed for allergies.     metoprolol tartrate (LOPRESSOR) 25 MG tablet Take 1 tablet (25 mg total) by mouth once for 1 dose. Take 2 hours before CT scan 1 tablet 0   Multiple Vitamins-Minerals (MULTIVITAMIN WITH MINERALS) tablet Take 1 tablet by mouth daily.     No current facility-administered medications for this visit.    Allergies:   Flexeril [cyclobenzaprine] and Sildenafil    Social History:  The patient  reports that he has never smoked. He does not have any smokeless tobacco history on file. He reports current alcohol use. He reports that he does not use drugs.   Family History:  The patient's family history includes Breast cancer in his mother; Hyperlipidemia in his brother; Hypertension in his father; Ovarian cancer in his mother; Pulmonary fibrosis in his father.    ROS:  Please see the history of present illness.   Otherwise, review of systems are positive for none.   All other systems are reviewed  and negative.    PHYSICAL EXAM: VS:  BP 134/86 (BP Location: Left Arm, Patient Position: Sitting, Cuff Size: Large)   Pulse (!) 59   Ht 5\' 10"  (1.778 m)   Wt 236 lb 12.8 oz (107.4 kg)   SpO2 97%   BMI 33.98 kg/m  , BMI Body mass index is 33.98 kg/m. GENERAL:  Well appearing HEENT:  Pupils equal round and reactive, fundi not visualized, oral mucosa unremarkable NECK:  No jugular venous distention, waveform within normal limits, carotid upstroke brisk and symmetric, no bruits, no thyromegaly LYMPHATICS:  No cervical, inguinal adenopathy LUNGS:  Clear to auscultation bilaterally BACK:  No CVA tenderness CHEST:  Unremarkable HEART:  PMI not displaced or sustained,S1 and S2 within normal limits, no S3, no S4, no clicks, no rubs, no murmurs ABD:  Flat, positive bowel sounds normal in frequency in pitch, no  bruits, no rebound, no guarding, no midline pulsatile mass, no hepatomegaly, no splenomegaly EXT:  2 plus pulses throughout, no edema, no cyanosis no clubbing SKIN:  No rashes no nodules NEURO:  Cranial nerves II through XII grossly intact, motor grossly intact throughout [facility]:  Cognitively intact, oriented to person place and time    EKG:  EKG Interpretation Date/Time:  Friday December 08 2022 08:54:18 EDT Ventricular Rate:  59 PR Interval:  154 QRS Duration:  98 QT Interval:  418 QTC Calculation: 413 R Axis:   42  Text Interpretation: Sinus bradycardia When compared with ECG of 01-Oct-2013 14:29, No change Confirmed by Rollene Rotunda ([id number]) on 12/08/2022 9:08:10 AM     Recent Labs: 08/08/2022: ALT 31; BUN 8; Creatinine, Ser 1.01; Hemoglobin 12.4; Platelets 120; Potassium 3.4; Sodium 129    Lipid Panel No results found for: "CHOL", "TRIG", "HDL", "CHOLHDL", "VLDL", "LDLCALC", "LDLDIRECT"    Wt Readings from Last 3 Encounters:  12/08/22 236 lb 12.8 oz (107.4 kg)  08/05/22 260 lb (117.9 kg)  02/10/13 251 lb (113.9 kg)      Other studies Reviewed: Additional studies/ records that were reviewed today include: Labs. Review of the above records demonstrates:  Please see elsewhere in the note.     ASSESSMENT AND PLAN:  CHEST PAIN: The patient has jaw and upper chest discomfort.  The pretest probability of obstructive coronary disease is at least moderate.  Coronary CTA is indicated.  Further management will be based on these results.  SLEEP APNEA: He is awaiting a new sleep study as he had recall of his mask.  OVERWEIGHT: We did discuss diet and exercise.  RISK REDUCTION: The patient had an HDL of 31 and LDL of 60.  Goals of therapy will be based on results of the findings above.  Current medicines are reviewed at length with the patient today.  The patient does not have concerns regarding medicines.  The following changes have been made:  no change  Labs/ tests  ordered today include:   Orders Placed This Encounter  Procedures   CT CORONARY MORPH W/CTA COR W/SCORE W/CA W/CM &/OR WO/CM   Basic Metabolic Panel (BMET)   EKG 12-Lead     Disposition:   FU with me based on the results of the above.     Signed, Rollene Rotunda, MD  12/08/2022 9:48 AM    [facility]

## 2022-12-08 ENCOUNTER — Ambulatory Visit: Payer: 59 | Attending: Cardiology | Admitting: Cardiology

## 2022-12-08 ENCOUNTER — Encounter: Payer: Self-pay | Admitting: Cardiology

## 2022-12-08 VITALS — BP 134/86 | HR 59 | Ht 70.0 in | Wt 236.8 lb

## 2022-12-08 DIAGNOSIS — R072 Precordial pain: Secondary | ICD-10-CM | POA: Diagnosis not present

## 2022-12-08 DIAGNOSIS — Z01812 Encounter for preprocedural laboratory examination: Secondary | ICD-10-CM | POA: Diagnosis not present

## 2022-12-08 DIAGNOSIS — R0602 Shortness of breath: Secondary | ICD-10-CM | POA: Diagnosis not present

## 2022-12-08 DIAGNOSIS — G4730 Sleep apnea, unspecified: Secondary | ICD-10-CM | POA: Diagnosis not present

## 2022-12-08 LAB — BASIC METABOLIC PANEL
BUN/Creatinine Ratio: 18 (ref 9–20)
BUN: 17 mg/dL (ref 6–24)
CO2: 25 mmol/L (ref 20–29)
Calcium: 10 mg/dL (ref 8.7–10.2)
Chloride: 100 mmol/L (ref 96–106)
Creatinine, Ser: 0.95 mg/dL (ref 0.76–1.27)
Glucose: 140 mg/dL — ABNORMAL HIGH (ref 70–99)
Potassium: 4.4 mmol/L (ref 3.5–5.2)
Sodium: 139 mmol/L (ref 134–144)
eGFR: 93 mL/min/{1.73_m2} (ref >59)

## 2022-12-08 MED ORDER — METOPROLOL TARTRATE 25 MG PO TABS: 25.0000 mg | ORAL_TABLET | Freq: Once | ORAL | 0 refills | Status: DC

## 2022-12-08 NOTE — Patient Instructions (Addendum)
Medication Instructions:  Your physician recommends that you continue on your current medications as directed. Please refer to the Current Medication list given to you today.    *If you need a refill on your cardiac medications before your next appointment, please call your pharmacy*   Lab Work: Labs being drawn today:  - BMET    If you have labs (blood work) drawn today and your tests are completely normal, you will receive your results only by: MyChart Message (if you have MyChart) OR A paper copy in the mail If you have any lab test that is abnormal or we need to change your treatment, we will call you to review the results.   Testing/Procedures:   Your cardiac CT will be scheduled at one of the below locations:   [facility] [address] [place], [place] [address] [phone]    If scheduled at [facility], please arrive at the [facility] (Entrance C2) of [facility] 30 minutes prior to test start time. You can use the FREE valet parking offered at entrance C (encouraged to control the heart rate for the test)  Proceed to the [facility] Radiology Department (first floor) to check-in and test prep.  All radiology patients and guests should use entrance C2 at [facility], accessed from [facility], even though the hospital's physical address listed is [address].        Please follow these instructions carefully (unless otherwise directed):  An IV will be required for this test and Nitroglycerin will be given.  Hold all erectile dysfunction medications at least 3 days (72 hrs) prior to test. (Ie viagra, cialis, sildenafil, tadalafil, etc)   On the Night Before the Test: Be sure to Drink plenty of water. Do not consume any caffeinated/decaffeinated beverages or chocolate 12 hours prior to your test. Do not take any antihistamines 12 hours prior to your test.    On the Day of  the Test: Drink plenty of water until 1 hour prior to the test. Do not eat any food 1 hour prior to test. You may take your regular medications prior to the test.  Take metoprolol tartrate (Lopressor) two hours prior to test.         After the Test: Drink plenty of water. After receiving IV contrast, you may experience a mild flushed feeling. This is normal. On occasion, you may experience a mild rash up to 24 hours after the test. This is not dangerous. If this occurs, you can take Benadryl 25 mg and increase your fluid intake. If you experience trouble breathing, this can be serious. If it is severe call 911 IMMEDIATELY. If it is mild, please call our office. If you take any of these medications: Glipizide/Metformin, Avandament, Glucavance, please do not take 48 hours after completing test unless otherwise instructed.  We will call to schedule your test 2-4 weeks out understanding that some insurance companies will need an authorization prior to the service being performed.   For more information and frequently asked questions, please visit our website : [email or web address]  For non-scheduling related questions, please contact the cardiac imaging nurse navigator should you have any questions/concerns: Cardiac Imaging Nurse Navigators Direct Office Dial: [phone]   For scheduling needs, including cancellations and rescheduling, please call [place], [phone].    Follow-Up: At [facility], you and your health needs are our priority.  As part of our continuing mission to provide you with exceptional  heart care, we have created designated Provider Care Teams.  These Care Teams include your primary Cardiologist (physician) and Advanced Practice Providers (APPs -  Physician Assistants and Nurse Practitioners) who all work together to provide you with the care you need, when you need it.  Your next appointment:   Follow up as needed    Provider:   Rollene Rotunda,  MD

## 2022-12-11 ENCOUNTER — Other Ambulatory Visit: Payer: Self-pay | Admitting: *Deleted

## 2022-12-11 ENCOUNTER — Encounter: Payer: Self-pay | Admitting: Cardiology

## 2022-12-11 DIAGNOSIS — R739 Hyperglycemia, unspecified: Secondary | ICD-10-CM

## 2022-12-15 ENCOUNTER — Telehealth (HOSPITAL_COMMUNITY): Payer: Self-pay | Admitting: *Deleted

## 2022-12-15 NOTE — Telephone Encounter (Signed)
Reaching out to patient to offer assistance regarding upcoming cardiac imaging study; pt verbalizes understanding of appt date/time, parking situation and where to check in, pre-test NPO status and medications ordered, and verified current allergies; name and call back number provided for further questions should they arise Johney Frame RN Navigator Cardiac Imaging [facility] [phone] office [phone] cell

## 2022-12-17 ENCOUNTER — Encounter (HOSPITAL_BASED_OUTPATIENT_CLINIC_OR_DEPARTMENT_OTHER): Payer: Self-pay

## 2022-12-18 ENCOUNTER — Ambulatory Visit (HOSPITAL_COMMUNITY): Admission: RE | Admit: 2022-12-18 | Payer: 59 | Source: Ambulatory Visit

## 2022-12-18 ENCOUNTER — Ambulatory Visit (HOSPITAL_BASED_OUTPATIENT_CLINIC_OR_DEPARTMENT_OTHER)
Admission: RE | Admit: 2022-12-18 | Discharge: 2022-12-18 | Disposition: A | Discharge status: Home / Self Care | Payer: 59 | Source: Ambulatory Visit | Attending: Cardiology | Admitting: Cardiology

## 2022-12-18 DIAGNOSIS — R0602 Shortness of breath: Secondary | ICD-10-CM | POA: Insufficient documentation

## 2022-12-18 DIAGNOSIS — R072 Precordial pain: Secondary | ICD-10-CM | POA: Diagnosis present

## 2022-12-18 DIAGNOSIS — I2510 Atherosclerotic heart disease of native coronary artery without angina pectoris: Secondary | ICD-10-CM | POA: Diagnosis not present

## 2022-12-18 DIAGNOSIS — R931 Abnormal findings on diagnostic imaging of heart and coronary circulation: Secondary | ICD-10-CM | POA: Diagnosis present

## 2022-12-18 MED ORDER — NITROGLYCERIN 0.4 MG SL SUBL
0.8000 mg | SUBLINGUAL_TABLET | Freq: Once | SUBLINGUAL | Status: AC
Start: 2022-12-18 — End: 2022-12-18
  Administered 2022-12-18: 0.8 mg via SUBLINGUAL

## 2022-12-18 MED ORDER — IOHEXOL 350 MG/ML SOLN
100.0000 mL | Freq: Once | INTRAVENOUS | Status: AC | PRN
  Administered 2022-12-18: 100 mL via INTRAVENOUS

## 2022-12-18 NOTE — Progress Notes (Signed)
Patient presents for cardiac CT scan.   Patient tolerated well.  Patient denies symptoms.  Patient ambulatory out of department.

## 2022-12-19 ENCOUNTER — Ambulatory Visit (HOSPITAL_BASED_OUTPATIENT_CLINIC_OR_DEPARTMENT_OTHER)
Admission: RE | Admit: 2022-12-19 | Discharge: 2022-12-19 | Disposition: A | Discharge status: Home / Self Care | Payer: 59 | Source: Ambulatory Visit | Attending: Cardiovascular Disease | Admitting: Cardiovascular Disease

## 2022-12-19 ENCOUNTER — Other Ambulatory Visit: Payer: Self-pay | Admitting: Cardiovascular Disease

## 2022-12-19 DIAGNOSIS — R931 Abnormal findings on diagnostic imaging of heart and coronary circulation: Secondary | ICD-10-CM

## 2022-12-19 DIAGNOSIS — I2510 Atherosclerotic heart disease of native coronary artery without angina pectoris: Secondary | ICD-10-CM | POA: Diagnosis not present

## 2022-12-19 NOTE — H&P (View-Only) (Signed)
CT FFR.  Andrew Price T. Flora Lipps, MD, [facility]  [facility]  [address], [address] [place], [place] [address] [phone]  8:19 AM

## 2022-12-19 NOTE — Progress Notes (Signed)
CT FFR.  Gerri Spore T. Flora Lipps, MD, [facility]  [facility]  [address], [address] [place], [place] [address] [phone]  8:19 AM

## 2022-12-20 ENCOUNTER — Telehealth: Payer: Self-pay | Admitting: *Deleted

## 2022-12-20 ENCOUNTER — Other Ambulatory Visit: Payer: Self-pay | Admitting: *Deleted

## 2022-12-20 ENCOUNTER — Encounter: Payer: Self-pay | Admitting: *Deleted

## 2022-12-20 DIAGNOSIS — R931 Abnormal findings on diagnostic imaging of heart and coronary circulation: Secondary | ICD-10-CM

## 2022-12-20 NOTE — Telephone Encounter (Signed)
-----   Message from Rollene Rotunda sent at 12/19/2022  4:35 PM EDT ----- Needs cath.  Please call.  He is deciding what day.    The patient understands that risks included but are not limited to stroke (1 in 1000), death (1 in 1000), kidney failure [usually temporary] (1 in 500), bleeding (1 in 200), allergic reaction [possibly serious] (1 in 200).  The patient understands and agrees to proceed.   Send results to Sheliah Hatch, PA-C

## 2022-12-20 NOTE — Telephone Encounter (Signed)
Patient is scheduled for a cath 12/21/22 @ 9 am. He will have a stat cbc today. Instructions discussed with the patient and sent to my chart.

## 2022-12-21 ENCOUNTER — Other Ambulatory Visit (HOSPITAL_COMMUNITY): Payer: Self-pay

## 2022-12-21 ENCOUNTER — Other Ambulatory Visit: Payer: Self-pay

## 2022-12-21 ENCOUNTER — Encounter (HOSPITAL_COMMUNITY): Payer: Self-pay | Admitting: Interventional Cardiology

## 2022-12-21 ENCOUNTER — Ambulatory Visit (HOSPITAL_COMMUNITY)
Admission: RE | Admit: 2022-12-21 | Discharge: 2022-12-21 | Disposition: A | Discharge status: Home / Self Care | Payer: 59 | Attending: Interventional Cardiology | Admitting: Interventional Cardiology

## 2022-12-21 ENCOUNTER — Encounter (HOSPITAL_COMMUNITY)
Admission: RE | Disposition: A | Discharge status: Home / Self Care | Payer: Self-pay | Source: Home / Self Care | Attending: Interventional Cardiology

## 2022-12-21 DIAGNOSIS — Z7902 Long term (current) use of antithrombotics/antiplatelets: Secondary | ICD-10-CM | POA: Diagnosis not present

## 2022-12-21 DIAGNOSIS — G4733 Obstructive sleep apnea (adult) (pediatric): Secondary | ICD-10-CM | POA: Diagnosis not present

## 2022-12-21 DIAGNOSIS — Z955 Presence of coronary angioplasty implant and graft: Secondary | ICD-10-CM

## 2022-12-21 DIAGNOSIS — E781 Pure hyperglyceridemia: Secondary | ICD-10-CM | POA: Insufficient documentation

## 2022-12-21 DIAGNOSIS — R931 Abnormal findings on diagnostic imaging of heart and coronary circulation: Secondary | ICD-10-CM

## 2022-12-21 DIAGNOSIS — Z79899 Other long term (current) drug therapy: Secondary | ICD-10-CM | POA: Diagnosis not present

## 2022-12-21 DIAGNOSIS — Z7982 Long term (current) use of aspirin: Secondary | ICD-10-CM | POA: Insufficient documentation

## 2022-12-21 DIAGNOSIS — I10 Essential (primary) hypertension: Secondary | ICD-10-CM | POA: Diagnosis not present

## 2022-12-21 DIAGNOSIS — R079 Chest pain, unspecified: Secondary | ICD-10-CM | POA: Insufficient documentation

## 2022-12-21 DIAGNOSIS — I25118 Atherosclerotic heart disease of native coronary artery with other forms of angina pectoris: Secondary | ICD-10-CM | POA: Diagnosis present

## 2022-12-21 HISTORY — PX: CORONARY STENT INTERVENTION: CATH118234

## 2022-12-21 HISTORY — PX: CORONARY ULTRASOUND/IVUS: CATH118244

## 2022-12-21 HISTORY — PX: LEFT HEART CATH AND CORONARY ANGIOGRAPHY: CATH118249

## 2022-12-21 LAB — CBC
Hematocrit: 48.3 % (ref 37.5–51.0)
Hemoglobin: 16.6 g/dL (ref 13.0–17.7)
MCH: 31.8 pg (ref 26.6–33.0)
MCHC: 34.4 g/dL (ref 31.5–35.7)
MCV: 93 fL (ref 79–97)
Platelets: 161 10*3/uL (ref 150–450)
RBC: 5.22 x10E6/uL (ref 4.14–5.80)
RDW: 13.5 % (ref 11.6–15.4)
WBC: 6.4 10*3/uL (ref 3.4–10.8)

## 2022-12-21 LAB — HEMOGLOBIN A1C
Est. average glucose Bld gHb Est-mCnc: 128 mg/dL
Hgb A1c MFr Bld: 6.1 % — ABNORMAL HIGH (ref 4.8–5.6)

## 2022-12-21 LAB — POCT ACTIVATED CLOTTING TIME
Activated Clotting Time: 250 seconds
Activated Clotting Time: 336 seconds

## 2022-12-21 SURGERY — LEFT HEART CATH AND CORONARY ANGIOGRAPHY
Anesthesia: LOCAL

## 2022-12-21 MED ORDER — SODIUM CHLORIDE 0.9% FLUSH: 3.0000 mL | Freq: Two times a day (BID) | INTRAVENOUS | Status: DC

## 2022-12-21 MED ORDER — SODIUM CHLORIDE 0.9% FLUSH: 3.0000 mL | INTRAVENOUS | Status: DC | PRN

## 2022-12-21 MED ORDER — SODIUM CHLORIDE 0.9 % WEIGHT BASED INFUSION: 1.0000 mL/kg/h | INTRAVENOUS | Status: DC

## 2022-12-21 MED ORDER — SODIUM CHLORIDE 0.9 % IV SOLN: 250.0000 mL | INTRAVENOUS | Status: DC | PRN

## 2022-12-21 MED ORDER — NITROGLYCERIN 0.4 MG SL SUBL
SUBLINGUAL_TABLET | SUBLINGUAL | Status: AC
  Filled 2022-12-21: qty 1

## 2022-12-21 MED ORDER — HYDRALAZINE HCL 20 MG/ML IJ SOLN: 10.0000 mg | INTRAMUSCULAR | Status: DC | PRN

## 2022-12-21 MED ORDER — NEOMYCIN-POLYMYXIN-DEXAMETH 0.1 % OP OINT: 1.0000 | TOPICAL_OINTMENT | OPHTHALMIC | Status: DC

## 2022-12-21 MED ORDER — ASPIRIN 81 MG PO CHEW: 81.0000 mg | CHEWABLE_TABLET | Freq: Every day | ORAL | Status: DC

## 2022-12-21 MED ORDER — LIDOCAINE HCL (PF) 1 % IJ SOLN
INTRAMUSCULAR | Status: DC | PRN
  Administered 2022-12-21: 2 mL

## 2022-12-21 MED ORDER — LABETALOL HCL 5 MG/ML IV SOLN: 10.0000 mg | INTRAVENOUS | Status: AC | PRN

## 2022-12-21 MED ORDER — CLOPIDOGREL BISULFATE 300 MG PO TABS
ORAL_TABLET | ORAL | Status: AC
  Filled 2022-12-21: qty 2

## 2022-12-21 MED ORDER — HYDRALAZINE HCL 20 MG/ML IJ SOLN: 10.0000 mg | INTRAMUSCULAR | Status: AC | PRN

## 2022-12-21 MED ORDER — NITROGLYCERIN 0.4 MG SL SUBL
0.4000 mg | SUBLINGUAL_TABLET | SUBLINGUAL | Status: DC | PRN
  Administered 2022-12-21: 0.4 mg via SUBLINGUAL

## 2022-12-21 MED ORDER — ACETAMINOPHEN 325 MG PO TABS: 650.0000 mg | ORAL_TABLET | ORAL | Status: DC | PRN

## 2022-12-21 MED ORDER — SODIUM CHLORIDE 0.9 % IV SOLN: INTRAVENOUS | Status: AC

## 2022-12-21 MED ORDER — CLOPIDOGREL BISULFATE 75 MG PO TABS: 75.0000 mg | ORAL_TABLET | Freq: Every day | ORAL | Status: DC

## 2022-12-21 MED ORDER — HEPARIN SODIUM (PORCINE) 1000 UNIT/ML IJ SOLN
INTRAMUSCULAR | Status: DC | PRN
  Administered 2022-12-21: 3000 [IU] via INTRAVENOUS
  Administered 2022-12-21: 5000 [IU] via INTRAVENOUS
  Administered 2022-12-21: 7000 [IU] via INTRAVENOUS

## 2022-12-21 MED ORDER — ONDANSETRON HCL 4 MG/2ML IJ SOLN
INTRAMUSCULAR | Status: DC | PRN
  Administered 2022-12-21: 4 mg via INTRAVENOUS

## 2022-12-21 MED ORDER — LORATADINE 10 MG PO TABS: 10.0000 mg | ORAL_TABLET | Freq: Every day | ORAL | Status: DC | PRN

## 2022-12-21 MED ORDER — VERAPAMIL HCL 2.5 MG/ML IV SOLN
INTRAVENOUS | Status: DC | PRN
  Administered 2022-12-21 (×2): 10 mL via INTRA_ARTERIAL

## 2022-12-21 MED ORDER — ASPIRIN 81 MG PO CHEW: 81.0000 mg | CHEWABLE_TABLET | ORAL | Status: DC

## 2022-12-21 MED ORDER — NITROGLYCERIN 0.4 MG SL SUBL
0.4000 mg | SUBLINGUAL_TABLET | SUBLINGUAL | 2 refills | Status: DC | PRN
  Filled 2022-12-21: qty 25, 8d supply, fill #0

## 2022-12-21 MED ORDER — NITROGLYCERIN 1 MG/10 ML FOR IR/CATH LAB
INTRA_ARTERIAL | Status: DC | PRN
  Administered 2022-12-21: 500 ug via INTRA_ARTERIAL

## 2022-12-21 MED ORDER — CLOPIDOGREL BISULFATE 300 MG PO TABS
ORAL_TABLET | ORAL | Status: DC | PRN
  Administered 2022-12-21: 600 mg via ORAL

## 2022-12-21 MED ORDER — ONDANSETRON HCL 4 MG/2ML IJ SOLN: 4.0000 mg | Freq: Four times a day (QID) | INTRAMUSCULAR | Status: DC | PRN

## 2022-12-21 MED ORDER — FENOFIBRATE 160 MG PO TABS: 160.0000 mg | ORAL_TABLET | Freq: Every day | ORAL | 1 refills | Status: DC

## 2022-12-21 MED ORDER — MIDAZOLAM HCL 2 MG/2ML IJ SOLN
INTRAMUSCULAR | Status: AC
  Filled 2022-12-21: qty 2

## 2022-12-21 MED ORDER — HEPARIN SODIUM (PORCINE) 1000 UNIT/ML IJ SOLN
INTRAMUSCULAR | Status: AC
  Filled 2022-12-21: qty 10

## 2022-12-21 MED ORDER — HEPARIN (PORCINE) IN NACL 1000-0.9 UT/500ML-% IV SOLN
INTRAVENOUS | Status: DC | PRN
  Administered 2022-12-21 (×2): 500 mL

## 2022-12-21 MED ORDER — NITROGLYCERIN 1 MG/10 ML FOR IR/CATH LAB
INTRA_ARTERIAL | Status: AC
  Filled 2022-12-21: qty 10

## 2022-12-21 MED ORDER — LABETALOL HCL 5 MG/ML IV SOLN: 10.0000 mg | INTRAVENOUS | Status: DC | PRN

## 2022-12-21 MED ORDER — HYDRALAZINE HCL 20 MG/ML IJ SOLN: INTRAMUSCULAR | Status: DC | PRN

## 2022-12-21 MED ORDER — FAMOTIDINE IN NACL 20-0.9 MG/50ML-% IV SOLN
INTRAVENOUS | Status: AC
  Filled 2022-12-21: qty 50

## 2022-12-21 MED ORDER — FAMOTIDINE IN NACL 20-0.9 MG/50ML-% IV SOLN
INTRAVENOUS | Status: AC | PRN
  Administered 2022-12-21: 20 mg via INTRAVENOUS

## 2022-12-21 MED ORDER — GEMFIBROZIL 600 MG PO TABS: 600.0000 mg | ORAL_TABLET | Freq: Every day | ORAL | Status: DC

## 2022-12-21 MED ORDER — MIDAZOLAM HCL 2 MG/2ML IJ SOLN
INTRAMUSCULAR | Status: DC | PRN
  Administered 2022-12-21 (×2): 1 mg via INTRAVENOUS
  Administered 2022-12-21: 2 mg via INTRAVENOUS

## 2022-12-21 MED ORDER — SODIUM CHLORIDE 0.9 % WEIGHT BASED INFUSION
3.0000 mL/kg/h | INTRAVENOUS | Status: DC
  Administered 2022-12-21: 3 mL/kg/h via INTRAVENOUS

## 2022-12-21 MED ORDER — ONDANSETRON HCL 4 MG/2ML IJ SOLN
INTRAMUSCULAR | Status: AC
  Filled 2022-12-21: qty 2

## 2022-12-21 MED ORDER — MULTI-VITAMIN/MINERALS PO TABS: 1.0000 | ORAL_TABLET | Freq: Every day | ORAL | Status: DC

## 2022-12-21 MED ORDER — FENTANYL CITRATE (PF) 100 MCG/2ML IJ SOLN
INTRAMUSCULAR | Status: DC | PRN
  Administered 2022-12-21 (×4): 25 ug via INTRAVENOUS

## 2022-12-21 MED ORDER — LIDOCAINE HCL (PF) 1 % IJ SOLN
INTRAMUSCULAR | Status: AC
  Filled 2022-12-21: qty 30

## 2022-12-21 MED ORDER — COLCHICINE 0.6 MG PO TABS: 0.6000 mg | ORAL_TABLET | Freq: Every day | ORAL | Status: DC | PRN

## 2022-12-21 MED ORDER — FENTANYL CITRATE (PF) 100 MCG/2ML IJ SOLN
INTRAMUSCULAR | Status: AC
  Filled 2022-12-21: qty 2

## 2022-12-21 MED ORDER — CLOPIDOGREL BISULFATE 75 MG PO TABS
75.0000 mg | ORAL_TABLET | Freq: Every day | ORAL | 2 refills | Status: DC
  Filled 2022-12-21: qty 90, 90d supply, fill #0

## 2022-12-21 MED ORDER — ROSUVASTATIN CALCIUM 40 MG PO TABS
40.0000 mg | ORAL_TABLET | Freq: Every day | ORAL | 1 refills | Status: DC
  Filled 2022-12-21: qty 90, 90d supply, fill #0

## 2022-12-21 MED ORDER — ASPIRIN 81 MG PO CHEW
81.0000 mg | CHEWABLE_TABLET | Freq: Every day | ORAL | 2 refills | Status: DC
  Filled 2022-12-21: qty 90, 90d supply, fill #0

## 2022-12-21 MED ORDER — LISINOPRIL 10 MG PO TABS: 10.0000 mg | ORAL_TABLET | Freq: Every day | ORAL | Status: DC

## 2022-12-21 MED ORDER — VERAPAMIL HCL 2.5 MG/ML IV SOLN
INTRAVENOUS | Status: AC
  Filled 2022-12-21: qty 2

## 2022-12-21 SURGICAL SUPPLY — 22 items
BALL SAPPHIRE NC24 4.5X8 (BALLOONS) ×1 IMPLANT
BALLN EMERGE MR 3.0X12 (BALLOONS) ×1 IMPLANT
BALLOON EMERGE MR 3.0X12 (BALLOONS) IMPLANT
BALLOON SAPPHIRE NC24 4.5X8 (BALLOONS) IMPLANT
CATH 5FR JL3.5 JR4 ANG PIG MP (CATHETERS) IMPLANT
CATH LAUNCHER 6FR EBU3.5 (CATHETERS) IMPLANT
CATH OPTICROSS HD (CATHETERS) IMPLANT
DEVICE RAD COMP TR BAND LRG (VASCULAR PRODUCTS) IMPLANT
GLIDESHEATH SLEND SS 6F .021 (SHEATH) IMPLANT
GUIDEWIRE INQWIRE 1.5J.035X260 (WIRE) IMPLANT
INQWIRE 1.5J .035X260CM (WIRE) ×1 IMPLANT
KIT ENCORE 26 ADVANTAGE (KITS) IMPLANT
KIT HEMO VALVE WATCHDOG (MISCELLANEOUS) IMPLANT
PACK CARDIAC CATHETERIZATION (CUSTOM PROCEDURE TRAY) ×2 IMPLANT
SET ATX-X65L (MISCELLANEOUS) IMPLANT
SHEATH PROBE COVER 6X72 (BAG) IMPLANT
SLED PULL BACK IVUS (MISCELLANEOUS) IMPLANT
STENT SYNERGY XD 4.0X16 (Permanent Stent) IMPLANT
STENT SYS SYNERGY XD 4.0X16 (Permanent Stent) ×1 IMPLANT
SYNERGY XD 4.0X16 (Permanent Stent) ×1 IMPLANT
TUBING CIL FLEX 10 FLL-RA (TUBING) IMPLANT
WIRE RUNTHROUGH .014X180CM (WIRE) IMPLANT

## 2022-12-21 NOTE — Progress Notes (Signed)
Client states had right neck pain for a few minutes like the pain he had prior to stent; states pain is gone now

## 2022-12-21 NOTE — Progress Notes (Signed)
CARDIAC REHAB PHASE I     Post stent education including site care, restrictions, risk factors, exercise guidelines, NTG use, antiplatelet therapy importance, heart healthy diet and CRP2 reviewed. All questions and concerns addressed. Will refer to [facility] for CRP2. Plan for home later today.   [id number]  Woodroe Chen, RN BSN 12/21/2022 12:15 PM

## 2022-12-21 NOTE — Progress Notes (Signed)
   Called to bedside by RN with patient experiencing increasing centralized/left-sided chest pain.  Status post PCI to LAD, did have chest pain while in the lab prior to arriving to short stay.  Was given Pepcid after Plavix load.  Repeat EKG with no ST changes.  Given 1 sublingual nitroglycerin with significant improvement in pain.  He is planned for same-day PCI today as long as he remains chest pain-free  Signed, Laverda Page, NP-C 12/21/2022, 12:01 PM Pager: [phone]

## 2022-12-21 NOTE — Progress Notes (Signed)
Client up and walked and tolerated well; no c/o chest pain or shortness of breath; Laverda Page, NP in to see client and ok to d/c home

## 2022-12-21 NOTE — Discharge Summary (Addendum)
Discharge Summary for Same Day PCI   Patient ID: Andrew Price MRN: [id number]; DOB: 1965-12-29  Admit date: 12/21/2022 Discharge date: 12/21/2022  Primary Care Provider: Henrine Screws, MD  Primary Cardiologist: Rollene Rotunda, MD  Primary Electrophysiologist:  None   Discharge Diagnoses    Active Problems:   Chest pain   Diagnostic Studies/Procedures    Cardiac Catheterization 12/21/2022:    Prox LAD lesion is 90% stenosed.   A drug-eluting stent was successfully placed using a SYNERGY XD 4.0X16, postdilated to 4.5 mm and optimized with intravascular ultrasound.   Post intervention, there is a 0% residual stenosis.   The left ventricular systolic function is normal.   LV end diastolic pressure is normal.   The left ventricular ejection fraction is 55-65% by visual estimate.   There is no aortic valve stenosis.   In the absence of any other complications or medical issues, we expect the patient to be ready for discharge from an interventional cardiology perspective on 12/21/2022.   Recommend uninterrupted dual antiplatelet therapy with Aspirin 81mg  daily and Clopidogrel 75mg  daily for a minimum of 6 months (stable ischemic heart disease-Class I recommendation).   Successful PCI of the proximal LAD with a drug-eluting stent as noted above.  Continue dual antiplatelet therapy for at least 6 months.  Continue clopidogrel monotherapy beyond 6 months along with aggressive secondary prevention.  Plan for discharge later today.  Results conveyed to the patient's wife.  Diagnostic Dominance: Right  Intervention   _____________   History of Present Illness     Andrew Price is a 57 y.o. male with hypertension, OSA, hypertriglyceridemia who was referred to cardiology by his PCP after having episodes of chest tightness radiating into his jaw.  Would have episodes while riding his exercise bike.  Also would be nauseated with diaphoresis.  He was set up for  outpatient cardiac CTA which showed severe stenosis of 70 to 99% in the LAD.  Was set up for CT FFR confirmed flow-limiting lesion.  As a result he was set up for outpatient cardiac catheterization.  Hospital Course     The patient underwent cardiac cath as noted above with 90% pLAD treated with PCI/DES x1. Plan for DAPT with ASA/plavix for at least 6 months, then plavix an additional 6 months. The patient was seen by cardiac rehab while in short stay. There were no observed complications post cath. Radial cath site was re-evaluated prior to discharge and found to be stable without any complications. Instructions/precautions regarding cath site care were given prior to discharge.  NARENDER PEDUZZI was seen by Dr. Eldridge Dace and determined stable for discharge home. Follow up with our office has been arranged. Medications are listed below. Pertinent changes include addition of ASA 81mg  daily, plavix, crestor 40mg  daily, SL nitro. Stopped PTA gemfibrozil, switched to fenofibrate 160mg  daily. _____________  Cath/PCI Registry Performance & Quality Measures: Aspirin prescribed? - Yes ADP Receptor Inhibitor (Plavix/Clopidogrel, Brilinta/Ticagrelor or Effient/Prasugrel) prescribed (includes medically managed patients)? - Yes High Intensity Statin (Lipitor 40-80mg  or Crestor 20-40mg ) prescribed? - Yes For EF <40%, was ACEI/ARB prescribed? - Not Applicable (EF >/= 40%) For EF <40%, Aldosterone Antagonist (Spironolactone or Eplerenone) prescribed? - Not Applicable (EF >/= 40%) Cardiac Rehab Phase II ordered (Included Medically managed Patients)? - Yes  _____________   Discharge Vitals Blood pressure 137/80, pulse 67, temperature 98.2 F (36.8 C), temperature source Oral, resp. rate 17, height 5\' 10"  (1.778 m), weight 106.6 kg, SpO2 99%.  Filed Weights  12/21/22 0715  Weight: 106.6 kg    Last Labs & Radiologic Studies    CBC Recent Labs    12/20/22 1417  WBC 6.4  HGB 16.6  HCT 48.3   MCV 93  PLT 161   Basic Metabolic Panel No results for input(s): "NA", "K", "CL", "CO2", "GLUCOSE", "BUN", "CREATININE", "CALCIUM", "MG", "PHOS" in the last 72 hours. Liver Function Tests No results for input(s): "AST", "ALT", "ALKPHOS", "BILITOT", "PROT", "ALBUMIN" in the last 72 hours. No results for input(s): "LIPASE", "AMYLASE" in the last 72 hours. High Sensitivity Troponin:   No results for input(s): "TROPONINIHS" in the last 720 hours.  BNP Invalid input(s): "POCBNP" D-Dimer No results for input(s): "DDIMER" in the last 72 hours. Hemoglobin A1C Recent Labs    12/20/22 1417  HGBA1C 6.1*   Fasting Lipid Panel No results for input(s): "CHOL", "HDL", "LDLCALC", "TRIG", "CHOLHDL", "LDLDIRECT" in the last 72 hours. Thyroid Function Tests No results for input(s): "TSH", "T4TOTAL", "T3FREE", "THYROIDAB" in the last 72 hours.  Invalid input(s): "FREET3" _____________  CARDIAC CATHETERIZATION  Result Date: 12/21/2022   Prox LAD lesion is 90% stenosed.   A drug-eluting stent was successfully placed using a SYNERGY XD 4.0X16, postdilated to 4.5 mm and optimized with intravascular ultrasound.   Post intervention, there is a 0% residual stenosis.   The left ventricular systolic function is normal.   LV end diastolic pressure is normal.   The left ventricular ejection fraction is 55-65% by visual estimate.   There is no aortic valve stenosis.   In the absence of any other complications or medical issues, we expect the patient to be ready for discharge from an interventional cardiology perspective on 12/21/2022.   Recommend uninterrupted dual antiplatelet therapy with Aspirin 81mg  daily and Clopidogrel 75mg  daily for a minimum of 6 months (stable ischemic heart disease-Class I recommendation). Successful PCI of the proximal LAD with a drug-eluting stent as noted above.  Continue dual antiplatelet therapy for at least 6 months.  Continue clopidogrel monotherapy beyond 6 months along with  aggressive secondary prevention.  Plan for discharge later today.  Results conveyed to the patient's wife.   CT CORONARY FFR DATA PREP & FLUID ANALYSIS  Result Date: 12/19/2022 EXAM: CT FFR analysis was performed on the original cardiac CTA dataset. Diagrammatic representation of the CT FFR analysis is provided in a separate PDF document in PACS. This dictation was created using the PDF document and an interactive 3D model of the results. The 3D model is not available in the EMR/PACS. INTERPRETATION: CT FFR provides simultaneous calculation of pressure and flow across the entire coronary tree. For clinical decision making, CT FFR values should be obtained 1-2 cm distal to the lower border of each stenosis measured. Coronary CTA-related artifacts may impair the diagnostic accuracy of the original cardiac CTA and FFR CT results. *Due to the fact that CT FFR represents a mathematically-derived analysis, it is recommended that the results be interpreted as follows: 1. CT FFR >0.80: Low likelihood of hemodynamic significance. 2. CT FFR 0.76-0.80: Borderline likelihood of hemodynamic significance. 3. CT FFR =< 0.75: High likelihood of hemodynamic significance. *Coronary CT Angiography-derived Fractional Flow Reserve Testing in Patients with Stable Coronary Artery Disease: Recommendations on Interpretation and Reporting. Radiology: Cardiothoracic Imaging. 2019;1(5):e190050 FINDINGS: 1. Left Main: 1.0; low likelihood of hemodynamic significance. 2. Prox LAD: 0.66; high likelihood of hemodynamic significance. 3. LCX: 0.97; low likelihood of hemodynamic significance. 4. RCA: 0.97; low likelihood of hemodynamic significance. 5. PDA: 0.86; low likelihood of hemodynamic  significance. IMPRESSION: 1.  Proximal LAD is obstructive by CT FFR (0.66). 2.  PDA is negative by CT FFR. Lennie Odor, MD Electronically Signed   By: Lennie Odor M.D.   On: 12/19/2022 14:34   CT CORONARY MORPH W/CTA COR W/SCORE W/CA W/CM &/OR  WO/CM  Result Date: 12/18/2022 CLINICAL DATA:  Chest pain EXAM: Cardiac/Coronary CTA TECHNIQUE: A non-contrast, gated CT scan was obtained with axial slices of 3 mm through the heart for calcium scoring. Calcium scoring was performed using the Agatston method. A 120 kV prospective, gated, contrast cardiac scan was obtained. Gantry rotation speed was 250 msecs and collimation was 0.6 mm. Two sublingual nitroglycerin tablets (0.8 mg) were given. The 3D data set was reconstructed at 75% of the R-R cycle. Diastolic phases were analyzed on a dedicated workstation using MPR, MIP, and VRT modes. The patient received 95 cc of contrast. FINDINGS: Image quality: Excellent. Noise artifact is: Limited. Coronary Arteries:  Normal coronary origin.  Right dominance. Left main: The left main is a large caliber vessel with a normal take off from the left coronary cusp that trifurcates into a LAD, LCX, and ramus intermedius. There is no plaque or stenosis. Left anterior descending artery: The proximal LAD contains a severe stenosis (70-99%). The mid and distal LAD segments are patent. D1 contains minimal non-calcified plaque (<25%). D2 contains minimal calcified plaque (<25%). Ramus intermedius: Minimal mixed density plaque (<25%). Left circumflex artery: The LCX is non-dominant with minimal calcified plaque (<25%) in the proximal segment. The LCX gives off 1 patent obtuse marginal branch. Right coronary artery: The RCA is dominant with normal take off from the right coronary cusp. There is mild to moderate non-calcified plaque (40-50%) in the proximal segment. The mid and distal segments are patent. The PDA contains a severe non-calcified plaque (70-99%). The PLB is patent. Right Atrium: Right atrial size is within normal limits. Right Ventricle: The right ventricular cavity is within normal limits. Left Atrium: Left atrial size is normal in size with no left atrial appendage filling defect. Left Ventricle: The ventricular cavity  size is within normal limits. Pulmonary arteries: Normal in size. Pulmonary veins: Normal pulmonary venous drainage. Pericardium: Normal thickness without significant effusion or calcium present. Cardiac valves: The aortic valve is trileaflet without significant calcification. The mitral valve is normal without significant calcification. Aorta: Mildly dilated ascending aorta up to 40 mm. Extra-cardiac findings: See attached radiology report for non-cardiac structures. IMPRESSION: 1. Coronary calcium score of 87.9. This was 73rd percentile for age-, sex, and race-matched controls. 2. Normal coronary origin with right dominance. 3. Severe stenosis in the proximal LAD (70-99%). 4. Severe stenosis in the PDA (70-99%). 5. Mild to moderate stenosis (40-50%) in the proximal RCA. 6. 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 RECOMMENDATIONS: 1. Severe stenosis. (70-99% or > 50% left main). Cardiac catheterization or CT FFR is recommended. Consider symptom-guided anti-ischemic pharmacotherapy as well as risk factor modification per guideline directed care. Invasive coronary angiography recommended with revascularization per published guideline statements. Lennie Odor, MD Electronically Signed   By: Lennie Odor M.D.   On: 12/18/2022 11:34    Disposition   Pt is being discharged home today in good condition.  Follow-up Plans & Appointments     Follow-up Information     Marcelino Duster, [place] Follow up on 01/01/2023.   Specialties: Cardiology, Radiology Why: at 8:25am for your follow up appt with cardiology Contact information: [address] [place] [address] [phone]  Discharge Instructions     Amb Referral to Cardiac Rehabilitation   Complete by: As directed    Diagnosis: Coronary Stents   After initial evaluation and assessments completed: Virtual Based Care may be provided alone or in conjunction with Phase 2 Cardiac Rehab based on patient barriers.:  Yes   Intensive Cardiac Rehabilitation (ICR) [facility] location only OR Traditional Cardiac Rehabilitation (TCR) *If criteria for ICR are not met will enroll in TCR [facility] only): Yes        Discharge Medications   Allergies as of 12/21/2022       Reactions   Flexeril [cyclobenzaprine]    Hyperactive   Sildenafil    Other Reaction(s): nasal congestion        Medication List     STOP taking these medications    gemfibrozil 600 MG tablet Commonly known as: LOPID       TAKE these medications    Aspirin Low Dose 81 MG chewable tablet Generic drug: aspirin Chew 1 tablet (81 mg total) by mouth daily. Start taking on: December 22, 2022   clopidogrel 75 MG tablet Commonly known as: PLAVIX Take 1 tablet (75 mg total) by mouth daily with breakfast. Start taking on: December 22, 2022   colchicine 0.6 MG tablet Take 0.6 mg by mouth daily as needed (gout).   fenofibrate 160 MG tablet Take 1 tablet (160 mg total) by mouth daily.   lisinopril 10 MG tablet Commonly known as: ZESTRIL Take 10 mg by mouth daily.   loratadine 10 MG tablet Commonly known as: CLARITIN Take 10 mg by mouth daily as needed for allergies.   multivitamin with minerals tablet Take 1 tablet by mouth daily. Men 50+   neomycin-polymyxin-dexameth 0.1 % Oint Commonly known as: MAXITROL Place 1 Application into both eyes once a week. Eye lid   nitroGLYCERIN 0.4 MG SL tablet Commonly known as: NITROSTAT Place 1 tablet (0.4 mg total) under the tongue every 5 (five) minutes as needed for chest pain.   rosuvastatin 40 MG tablet Commonly known as: Crestor Take 1 tablet (40 mg total) by mouth daily.        Allergies Allergies  Allergen Reactions   Flexeril [Cyclobenzaprine]     Hyperactive   Sildenafil     Other Reaction(s): nasal congestion    Outstanding Labs/Studies   FLP/LFTs in 8 weeks   Duration of Discharge Encounter   Greater than 30 minutes including physician  time.  Signed, Laverda Page, NP 12/21/2022, 3:22 PM  I have examined the patient and reviewed assessment and plan and discussed with patient.  Agree with above as stated.    Successful PCI of prox LAD.  Needs DAPT for at least 6 months along with aggressive secondary prevention.  Radial precautions discussed as he is planning a trip to the beach tomorrow.  Lance Muss

## 2022-12-21 NOTE — Interval H&P Note (Signed)
Cath Lab Visit (complete for each Cath Lab visit)  Clinical Evaluation Leading to the Procedure:   ACS: No.  Non-ACS:    Anginal Classification: CCS II  Anti-ischemic medical therapy: Minimal Therapy (1 class of medications)  Non-Invasive Test Results: High-risk stress test findings: cardiac mortality >3%/year  Prior CABG: No previous CABG      History and Physical Interval Note:  12/21/2022 8:42 AM  Andrew Price  has presented today for surgery, with the diagnosis of abnormal ct.  The various methods of treatment have been discussed with the patient and family. After consideration of risks, benefits and other options for treatment, the patient has consented to  Procedure(s): LEFT HEART CATH AND CORONARY ANGIOGRAPHY (N/A) as a surgical intervention.  The patient's history has been reviewed, patient examined, no change in status, stable for surgery.  I have reviewed the patient's chart and labs.  Questions were answered to the patient's satisfaction.     Lance Muss

## 2022-12-21 NOTE — TOC Benefit Eligibility Note (Signed)
Patient Product/process development scientist completed.    The patient is insured through [facility]. Patient has [employer], may use a copay card, and/or apply for patient assistance if available.    Ran test claim for Vascepa 1 g and Non Formulary  Ran test claim for icosapent Ethyl (Vascepa) 1 g and Non Formulary   This test claim was processed through [facility]- copay amounts may vary at other pharmacies due to [employer], or as the patient moves through the different stages of their insurance plan.     Roland Earl, CPHT Pharmacy Technician III Certified Patient Advocate [facility] Patient Advocate Team Direct Number: [phone]  Fax: [phone]

## 2022-12-26 ENCOUNTER — Encounter: Payer: Self-pay | Admitting: *Deleted

## 2022-12-26 ENCOUNTER — Telehealth (HOSPITAL_COMMUNITY): Payer: Self-pay

## 2022-12-26 NOTE — Telephone Encounter (Signed)
Called patient to see if he is interested in the Cardiac Rehab Program. Patient expressed interest. Explained scheduling process and went over insurance, patient verbalized understanding. Will contact patient for scheduling once f/u has been completed.

## 2022-12-26 NOTE — Telephone Encounter (Signed)
Pt insurance is active and benefits verified through [facility]. Co-pay $0.00, DED $3,200.00/$3,200.00 met, out of pocket $7,600.00/$281.40 met, co-insurance 25%. No pre-authorization required. [employer], 12/26/22 @ 10:48AM, REF#[id number]   How many CR sessions are covered? (36 visits for TCR, 72 visits for ICR)72 Is this a lifetime maximum or an annual maximum? Lifetime Has the member used any of these services to date? No Is there a time limit (weeks/months) on start of program and/or program completion? No     Will contact patient to see if he is interested in the Cardiac Rehab Program. If interested, patient will need to complete follow up appt. Once completed, patient will be contacted for scheduling upon review by the RN Navigator.

## 2022-12-28 NOTE — Progress Notes (Signed)
Cardiology Office Note:    Date:  01/01/2023   ID:  Andrew Price, DOB Dec 01, 1965, MRN [id number]  PCP:  Henrine Screws, MD   [facility] Providers Cardiologist:  Rollene Rotunda, MD Cardiology APP:  Marcelino Duster, PA { Referring MD: Henrine Screws, MD   Chief Complaint  Patient presents with   Follow-up    Post PCI    History of Present Illness:    Andrew Price is a 57 y.o. male with a hx of CAD with recent PCI/DES-LAD 12/2022, HTN, OSA, and HLD.  He was referred to cardiology for chest tightness concerning for angina.  Coronary CTA was obtained and showed severe stenosis in the LAD positive by FFR.  He presented for outpatient heart catheterization which confirmed 90% stenosis in the pLAD successfully treated with DES 4.0 x 16 mm. He was discharged on ASA and plavix. LVEF 55-65% by visual estimate during cath.   He presents today for cath follow up. He is not tolerating fenofibrate (jittery). He is walking 15 min at a time. Is interested in cardiac rehab.  He sometimes has CP after eating, no exertional symptoms. Answered questions regarding post cath. He will switch to ASA EC and use voltaren gel instead of daily aleve. May use aleve once a week. He will let us know when muscle/joint aches begin to affect daily mobility. He still travels for work, we reviewed low sodium options for lunch.     Past Medical History:  Diagnosis Date   Sullivan Lone syndrome    Gout    Hypertension    Mild sleep apnea    Obesity (BMI 30-39.9)     Past Surgical History:  Procedure Laterality Date   CHOLECYSTECTOMY     CORONARY STENT INTERVENTION N/A 12/21/2022   Procedure: CORONARY STENT INTERVENTION;  Surgeon: Corky Crafts, MD;  Location: [facility] INVASIVE CV LAB;  Service: Cardiovascular;  Laterality: N/A;   CORONARY ULTRASOUND/IVUS N/A 12/21/2022   Procedure: Coronary Ultrasound/IVUS;  Surgeon: Corky Crafts, MD;  Location: [facility] INVASIVE CV LAB;  Service:  Cardiovascular;  Laterality: N/A;   HERNIA REPAIR     LEFT HEART CATH AND CORONARY ANGIOGRAPHY N/A 12/21/2022   Procedure: LEFT HEART CATH AND CORONARY ANGIOGRAPHY;  Surgeon: Corky Crafts, MD;  Location: [facility] INVASIVE CV LAB;  Service: Cardiovascular;  Laterality: N/A;    Current Medications: Current Meds  Medication Sig   aspirin EC (ASPIRIN EC ADULT LOW DOSE) 81 MG tablet Take 1 tablet (81 mg total) by mouth daily. Swallow whole.   colchicine 0.6 MG tablet Take 0.6 mg by mouth daily as needed (gout).   icosapent Ethyl (VASCEPA) 1 g capsule Take 1 capsule (1 g total) by mouth 2 (two) times daily.   loratadine (CLARITIN) 10 MG tablet Take 10 mg by mouth daily as needed for allergies.   Multiple Vitamins-Minerals (MULTIVITAMIN WITH MINERALS) tablet Take 1 tablet by mouth daily. Men 50+   neomycin-polymyxin-dexameth (MAXITROL) 0.1 % OINT Place 1 Application into both eyes once a week. Eye lid   nitroGLYCERIN (NITROSTAT) 0.4 MG SL tablet Place 1 tablet (0.4 mg total) under the tongue every 5 (five) minutes as needed for chest pain.   [DISCONTINUED] aspirin 81 MG chewable tablet Chew 1 tablet (81 mg total) by mouth daily.   [DISCONTINUED] clopidogrel (PLAVIX) 75 MG tablet Take 1 tablet (75 mg total) by mouth daily with breakfast.   [DISCONTINUED] fenofibrate 160 MG tablet Take 1 tablet (160 mg total) by mouth daily.   [  DISCONTINUED] lisinopril (ZESTRIL) 10 MG tablet Take 10 mg by mouth daily.   [DISCONTINUED] rosuvastatin (CRESTOR) 40 MG tablet Take 1 tablet (40 mg total) by mouth daily.     Allergies:   Flexeril [cyclobenzaprine] and Sildenafil   Social History   Socioeconomic History   Marital status: Married    Spouse name: Not on file   Number of children: Not on file   Years of education: Not on file   Highest education level: Not on file  Occupational History   Not on file  Tobacco Use   Smoking status: Never   Smokeless tobacco: Not on file  Substance and Sexual Activity    Alcohol use: Yes    Comment: social   Drug use: No   Sexual activity: Not on file  Other Topics Concern   Not on file  Social History Narrative   Lives with wife.     Social Determinants of Health   Financial Resource Strain: Not on file  Food Insecurity: No Food Insecurity (08/05/2022)   Hunger Vital Sign    Worried About Running Out of Food in the Last Year: Never true    Ran Out of Food in the Last Year: Never true  Transportation Needs: No Transportation Needs (08/05/2022)   PRAPARE - Administrator, Civil Service (Medical): No    Lack of Transportation (Non-Medical): No  Physical Activity: Not on file  Stress: Not on file  Social Connections: Not on file     Family History: The patient's family history includes Breast cancer in his mother; Hyperlipidemia in his brother; Hypertension in his father; Ovarian cancer in his mother; Pulmonary fibrosis in his father.  ROS:   Please see the history of present illness.     All other systems reviewed and are negative.  EKGs/Labs/Other Studies Reviewed:    The following studies were reviewed today:   Cardiac Catheterization 12/21/2022:     Prox LAD lesion is 90% stenosed.   A drug-eluting stent was successfully placed using a SYNERGY XD 4.0X16, postdilated to 4.5 mm and optimized with intravascular ultrasound.   Post intervention, there is a 0% residual stenosis.   The left ventricular systolic function is normal.   LV end diastolic pressure is normal.   The left ventricular ejection fraction is 55-65% by visual estimate.   There is no aortic valve stenosis.   In the absence of any other complications or medical issues, we expect the patient to be ready for discharge from an interventional cardiology perspective on 12/21/2022.   Recommend uninterrupted dual antiplatelet therapy with Aspirin 81mg  daily and Clopidogrel 75mg  daily for a minimum of 6 months (stable ischemic heart disease-Class I recommendation).    Successful PCI of the proximal LAD with a drug-eluting stent as noted above.  Continue dual antiplatelet therapy for at least 6 months.  Continue clopidogrel monotherapy beyond 6 months along with aggressive secondary prevention.  Plan for discharge later today.  Results conveyed to the patient's wife.   Diagnostic Dominance: Right  Intervention    _____________      Recent Labs: 08/08/2022: ALT 31 12/08/2022: BUN 17; Creatinine, Ser 0.95; Potassium 4.4; Sodium 139 12/20/2022: Hemoglobin 16.6; Platelets 161  Recent Lipid Panel No results found for: "CHOL", "TRIG", "HDL", "CHOLHDL", "VLDL", "LDLCALC", "LDLDIRECT"   Risk Assessment/Calculations:                Physical Exam:    VS:  BP 112/60 (BP Location: Left Arm, Patient Position: Sitting,  Cuff Size: Normal)   Pulse 64   Ht 5\' 10"  (1.778 m)   Wt 239 lb (108.4 kg)   SpO2 97%   BMI 34.29 kg/m     Wt Readings from Last 3 Encounters:  01/01/23 239 lb (108.4 kg)  12/21/22 235 lb (106.6 kg)  12/08/22 236 lb 12.8 oz (107.4 kg)     GEN:  Well nourished, well developed in no acute distress HEENT: Normal NECK: No JVD; No carotid bruits LYMPHATICS: No lymphadenopathy CARDIAC: RRR, no murmurs, rubs, gallops RESPIRATORY:  Clear to auscultation without rales, wheezing or rhonchi  ABDOMEN: Soft, non-tender, non-distended MUSCULOSKELETAL:  No edema; No deformity  SKIN: Warm and dry NEUROLOGIC:  Alert and oriented x 3 PSYCHIATRIC:  Normal affect  Right radial cath site C/D/I  ASSESSMENT:    1. CAD S/P percutaneous coronary angioplasty   2. Hyperlipidemia with target LDL less than 70   3. Primary hypertension   4. Obesity (BMI 30-39.9)    PLAN:    In order of problems listed above:  CAD DES-pLAD 12/21/22 ASA and plavix x at least 6 months for stable ischemic heart disease - no bleeding issues -He is cleared for cardiac rehab - No exertional chest pain   Hyperlipidemia with LDL goal < 70 Lipid panel 07/4032: Total  cholesterol 191 HDL: 31 LDL: 61 Triglycerides 663 Need LP(a) On fenofibrate and 40 mg crestor He is not tolerating fenofibrate secondary to feeling jittery, of note previously tolerated gemfibrozil Will start Vascepa today and repeat a lipid panel in 5 weeks with a follow-up Pharm.D. appointment in 6 weeks   HTN - 10 mg lisinopril -BP well-controlled, no medication changes   Lipid panel in 5 weeks pharmD appt in 6 weeks Follow up in 3 months    Cardiac Rehabilitation Eligibility Assessment  The patient is ready to start cardiac rehabilitation from a cardiac standpoint.          Medication Adjustments/Labs and Tests Ordered: Current medicines are reviewed at length with the patient today.  Concerns regarding medicines are outlined above.  Orders Placed This Encounter  Procedures   Lipoprotein A (LPA)   Lipid panel   AMB Referral to [facility]care Pharm-D   Meds ordered this encounter  Medications   icosapent Ethyl (VASCEPA) 1 g capsule    Sig: Take 1 capsule (1 g total) by mouth 2 (two) times daily.    Dispense:  180 capsule    Refill:  2   clopidogrel (PLAVIX) 75 MG tablet    Sig: Take 1 tablet (75 mg total) by mouth daily with breakfast.    Dispense:  90 tablet    Refill:  3   lisinopril (ZESTRIL) 10 MG tablet    Sig: Take 1 tablet (10 mg total) by mouth daily.    Dispense:  90 tablet    Refill:  3   rosuvastatin (CRESTOR) 40 MG tablet    Sig: Take 1 tablet (40 mg total) by mouth daily.    Dispense:  90 tablet    Refill:  3   aspirin EC (ASPIRIN EC ADULT LOW DOSE) 81 MG tablet    Sig: Take 1 tablet (81 mg total) by mouth daily. Swallow whole.    Dispense:  90 tablet    Refill:  3    Patient Instructions  Medication Instructions:  STOP FENOFIBRATE  START VASCEPA 1 G TWICE A DAY *If you need a refill on your cardiac medications before your next appointment, please call your pharmacy*  Lab Work: FASTING LIPIDS AND LPA IN 5 WEEKS. If you have labs (blood  work) drawn today and your tests are completely normal, you will receive your results only by: MyChart Message (if you have MyChart) OR A paper copy in the mail If you have any lab test that is abnormal or we need to change your treatment, we will call you to review the results.   Testing/Procedures: NO TESTING   Follow-Up: At [facility], you and your health needs are our priority.  As part of our continuing mission to provide you with exceptional heart care, we have created designated Provider Care Teams.  These Care Teams include your primary Cardiologist (physician) and Advanced Practice Providers (APPs -  Physician Assistants and Nurse Practitioners) who all work together to provide you with the care you need, when you need it.   Your next appointment:   3 month(s)  Provider:   Micah Flesher, PA-C or  Rollene Rotunda, MD   Other Instructions Need 6 week appointment with PharmD in Lipid clinic.    Signed, Marcelino Duster, [place]  01/01/2023 9:38 AM    [facility]

## 2022-12-29 ENCOUNTER — Encounter: Payer: Self-pay | Admitting: *Deleted

## 2023-01-01 ENCOUNTER — Encounter: Payer: Self-pay | Admitting: Physician Assistant

## 2023-01-01 ENCOUNTER — Ambulatory Visit: Payer: 59 | Attending: Physician Assistant | Admitting: Physician Assistant

## 2023-01-01 VITALS — BP 112/60 | HR 64 | Ht 70.0 in | Wt 239.0 lb

## 2023-01-01 DIAGNOSIS — I2510 Atherosclerotic heart disease of native coronary artery without angina pectoris: Secondary | ICD-10-CM | POA: Diagnosis not present

## 2023-01-01 DIAGNOSIS — I10 Essential (primary) hypertension: Secondary | ICD-10-CM | POA: Diagnosis not present

## 2023-01-01 DIAGNOSIS — E669 Obesity, unspecified: Secondary | ICD-10-CM

## 2023-01-01 DIAGNOSIS — E785 Hyperlipidemia, unspecified: Secondary | ICD-10-CM

## 2023-01-01 DIAGNOSIS — Z9861 Coronary angioplasty status: Secondary | ICD-10-CM

## 2023-01-01 MED ORDER — ROSUVASTATIN CALCIUM 40 MG PO TABS: 40.0000 mg | ORAL_TABLET | Freq: Every day | ORAL | 3 refills | Status: DC

## 2023-01-01 MED ORDER — CLOPIDOGREL BISULFATE 75 MG PO TABS: 75.0000 mg | ORAL_TABLET | Freq: Every day | ORAL | 3 refills | Status: DC

## 2023-01-01 MED ORDER — ICOSAPENT ETHYL 1 G PO CAPS
2.0000 g | ORAL_CAPSULE | Freq: Two times a day (BID) | ORAL | 11 refills | Status: DC
Start: 2023-01-01 — End: 2023-12-26

## 2023-01-01 MED ORDER — ICOSAPENT ETHYL 1 G PO CAPS: 1.0000 g | ORAL_CAPSULE | Freq: Two times a day (BID) | ORAL | 2 refills | Status: DC

## 2023-01-01 MED ORDER — ASPIRIN 81 MG PO TBEC
81.0000 mg | DELAYED_RELEASE_TABLET | Freq: Every day | ORAL | 3 refills | Status: DC
Start: 2023-01-01 — End: 2024-01-14

## 2023-01-01 MED ORDER — LISINOPRIL 10 MG PO TABS
10.0000 mg | ORAL_TABLET | Freq: Every day | ORAL | 3 refills | Status: DC
Start: 2023-01-01 — End: 2023-10-04

## 2023-01-01 NOTE — Patient Instructions (Signed)
Medication Instructions:  STOP FENOFIBRATE  START VASCEPA 1 G TWICE A DAY *If you need a refill on your cardiac medications before your next appointment, please call your pharmacy*   Lab Work: FASTING LIPIDS AND LPA IN 5 WEEKS. If you have labs (blood work) drawn today and your tests are completely normal, you will receive your results only by: MyChart Message (if you have MyChart) OR A paper copy in the mail If you have any lab test that is abnormal or we need to change your treatment, we will call you to review the results.   Testing/Procedures: NO TESTING   Follow-Up: At [facility], you and your health needs are our priority.  As part of our continuing mission to provide you with exceptional heart care, we have created designated Provider Care Teams.  These Care Teams include your primary Cardiologist (physician) and Advanced Practice Providers (APPs -  Physician Assistants and Nurse Practitioners) who all work together to provide you with the care you need, when you need it.   Your next appointment:   3 month(s)  Provider:   Micah Flesher, PA-C or  Rollene Rotunda, MD   Other Instructions Need 6 week appointment with PharmD in Lipid clinic.

## 2023-01-02 ENCOUNTER — Telehealth (HOSPITAL_COMMUNITY): Payer: Self-pay

## 2023-01-02 NOTE — Telephone Encounter (Signed)
Called patient to see if he was interested in participating in the Cardiac Rehab Program. Patient stated yes. Patient will come in for orientation on 01/04/23 @ 830AM and will attend the 645AM exercise class.   Pensions consultant.

## 2023-01-04 ENCOUNTER — Encounter (HOSPITAL_COMMUNITY)
Admission: RE | Admit: 2023-01-04 | Discharge: 2023-01-04 | Disposition: A | Discharge status: Home / Self Care | Payer: 59 | Source: Ambulatory Visit | Attending: Cardiology | Admitting: Cardiology

## 2023-01-04 ENCOUNTER — Telehealth (HOSPITAL_COMMUNITY): Payer: Self-pay

## 2023-01-04 ENCOUNTER — Telehealth: Payer: Self-pay | Admitting: Cardiology

## 2023-01-04 VITALS — Ht 70.0 in | Wt 241.4 lb

## 2023-01-04 DIAGNOSIS — I10 Essential (primary) hypertension: Secondary | ICD-10-CM | POA: Insufficient documentation

## 2023-01-04 DIAGNOSIS — Z6834 Body mass index (BMI) 34.0-34.9, adult: Secondary | ICD-10-CM | POA: Insufficient documentation

## 2023-01-04 DIAGNOSIS — Z48812 Encounter for surgical aftercare following surgery on the circulatory system: Secondary | ICD-10-CM | POA: Insufficient documentation

## 2023-01-04 DIAGNOSIS — Z955 Presence of coronary angioplasty implant and graft: Secondary | ICD-10-CM | POA: Insufficient documentation

## 2023-01-04 DIAGNOSIS — R079 Chest pain, unspecified: Secondary | ICD-10-CM | POA: Diagnosis not present

## 2023-01-04 DIAGNOSIS — E669 Obesity, unspecified: Secondary | ICD-10-CM | POA: Diagnosis not present

## 2023-01-04 NOTE — Telephone Encounter (Signed)
Andrew Price with cardiac called in asking to speak with triage nurse. Pt had jaw pain today during cardiac rehab.

## 2023-01-04 NOTE — Telephone Encounter (Signed)
Rosey Bath called from Dr Antoine Poche office to speak with Byrd Hesselbach. Byrd Hesselbach was currently on the phone and could not take her call. I did inform Rosey Bath of that and did take a call back number for [facility].

## 2023-01-04 NOTE — Progress Notes (Signed)
Andrew Price reported having tightness in his neck during his 6 minute walk test that lasted about 4 minutes. Andrew Price did not have any chest pain or jaw pain.  Andrew Price said that the tightness went away after sitting down. Andrew Price did say that he had tightness in his neck, chest tightness and jaw pain before his stent placement. Blood pressure post walk test 144/84. Heart rate 66. Will consult with onsite provider. Discussed with Dr Antoine Poche. 12 lead ordered and obtained. Dr Antoine Poche reviewed 12 lead ECG. Per Dr Antoine Poche.   please document EKG without acute changes. make sure he is taking his meds and has SLNTG and has instructions to go to the ED with resting chest pain or increasing exertional symptoms .  Joni Reining DNP on site came in and talked with the patient. Per Joni Reining DNP We are going to have him keep track of his BP at home. He was slightly hypertensive before starting rehab. Trying to get him in to be seen sooner than his f/u appt in Nov. Knows to go to ED if symptoms are persistent. He is stable and pain free now.   12 mins Andrew Price has an appointment to see Marjie Sk[facility] on 01/18/23 and is on the waitlist  [facility] left cardiac rehab without complaints or symptoms.Will continue to monitor the patient throughout  the program.Coleston Dirosa Mila Palmer RN BSN

## 2023-01-04 NOTE — Progress Notes (Signed)
Cardiac Rehab Medication Review   Does the patient  feel that his/her medications are working for him/her?  yes  Has the patient been experiencing any side effects to the medications prescribed?  no  Does the patient measure his/her own blood pressure or blood glucose at home?  no   Does the patient have any problems obtaining medications due to transportation or finances?   no  Understanding of regimen: excellent Understanding of indications: excellent Potential of compliance: excellent   Comments: Patient feels good about his medication routine. He does have a higher resting BP today at 148/86. He has a BP machine at home but is not regularly checking at this time. Cardiac nurse aware.   [name] 01/04/2023 9:04 AM

## 2023-01-04 NOTE — Progress Notes (Signed)
Cardiac Individual Treatment Plan  Patient Details  Name: EDIZ NEYLON MRN: [id number] Date of Birth: 1966-01-10 Referring Provider:   Flowsheet Row INTENSIVE CARDIAC REHAB ORIENT from 01/04/2023 in [facility]  Referring Provider Dr. Rollene Rotunda MD       Initial Encounter Date:  Flowsheet Row INTENSIVE CARDIAC REHAB ORIENT from 01/04/2023 in [facility], & Lung [facility]  Date 01/04/23       Visit Diagnosis: 12/21/22 DES LAD  Chest pain, unspecified type - Plan: EKG 12-Lead, EKG 12-Lead  Patient's Home Medications on Admission:  Current Outpatient Medications:    aspirin EC (ASPIRIN EC ADULT LOW DOSE) 81 MG tablet, Take 1 tablet (81 mg total) by mouth daily. Swallow whole., Disp: 90 tablet, Rfl: 3   clopidogrel (PLAVIX) 75 MG tablet, Take 1 tablet (75 mg total) by mouth daily with breakfast., Disp: 90 tablet, Rfl: 3   colchicine 0.6 MG tablet, Take 0.6 mg by mouth daily as needed (gout)., Disp: , Rfl:    icosapent Ethyl (VASCEPA) 1 g capsule, Take 2 capsules (2 g total) by mouth 2 (two) times daily., Disp: 120 capsule, Rfl: 11   lisinopril (ZESTRIL) 10 MG tablet, Take 1 tablet (10 mg total) by mouth daily., Disp: 90 tablet, Rfl: 3   loratadine (CLARITIN) 10 MG tablet, Take 10 mg by mouth daily as needed for allergies., Disp: , Rfl:    Multiple Vitamins-Minerals (MULTIVITAMIN WITH MINERALS) tablet, Take 1 tablet by mouth daily. Men 50+, Disp: , Rfl:    neomycin-polymyxin-dexameth (MAXITROL) 0.1 % OINT, Place 1 Application into both eyes once a week. Eye lid, Disp: , Rfl:    nitroGLYCERIN (NITROSTAT) 0.4 MG SL tablet, Place 1 tablet (0.4 mg total) under the tongue every 5 (five) minutes as needed for chest pain., Disp: 25 tablet, Rfl: 2   rosuvastatin (CRESTOR) 40 MG tablet, Take 1 tablet (40 mg total) by mouth daily., Disp: 90 tablet, Rfl: 3  Past Medical History: Past  Medical History:  Diagnosis Date   Sullivan Lone syndrome    Gout    Hypertension    Mild sleep apnea    Obesity (BMI 30-39.9)     Tobacco Use: Social History   Tobacco Use  Smoking Status Never  Smokeless Tobacco Not on file    Labs: Review Flowsheet       Latest Ref Rng & Units 12/20/2022  Labs for ITP Cardiac and Pulmonary Rehab  Hemoglobin A1c 4.8 - 5.6 % 6.1     Details            Capillary Blood Glucose: Lab Results  Component Value Date   GLUCAP 211 (H) 08/07/2022   GLUCAP 236 (H) 08/06/2022   GLUCAP 225 (H) 08/06/2022   GLUCAP 193 (H) 08/05/2022     Exercise Target Goals: Exercise Program Goal: Individual exercise prescription set using results from initial 6 min walk test and THRR while considering  patient's activity barriers and safety.   Exercise Prescription Goal: Initial exercise prescription builds to 30-45 minutes a day of aerobic activity, 2-3 days per week.  Home exercise guidelines will be given to patient during program as part of exercise prescription that the participant will acknowledge.  Activity Barriers & Risk Stratification:  Activity Barriers & Cardiac Risk Stratification - 01/04/23 1307       Activity Barriers & Cardiac Risk Stratification   Activity Barriers Back Problems;Deconditioning    Cardiac Risk Stratification  High   under 5 MET's            6 Minute Walk:  6 Minute Walk     Row Name 01/04/23 1250         6 Minute Walk   Phase Initial     Distance 1610 feet     Walk Time 6 minutes     # of Rest Breaks 0     MPH 3.04     METS 4.03     RPE 8     Perceived Dyspnea  0     VO2 Peak 14.12     Symptoms No     Resting HR 66 bpm     Resting BP 148/86     Resting Oxygen Saturation  99 %     Exercise Oxygen Saturation  during 6 min walk 100 %     Max Ex. HR 113 bpm     Max Ex. BP 150/86     2 Minute Post BP 144/84              Oxygen Initial Assessment:   Oxygen Re-Evaluation:   Oxygen Discharge  (Final Oxygen Re-Evaluation):   Initial Exercise Prescription:  Initial Exercise Prescription - 01/04/23 1300       Date of Initial Exercise RX and Referring Provider   Date 01/04/23    Referring Provider Dr. Rollene Rotunda MD    Expected Discharge Date 03/26/23      Recumbant Bike   Level 2    RPM 60    Watts 25    Minutes 15    METs 2      Arm Ergometer   Level 2    Watts 15    RPM 45    Minutes 15    METs 1.9      Prescription Details   Frequency (times per week) 3    Duration Progress to 30 minutes of continuous aerobic without signs/symptoms of physical distress      Intensity   THRR 40-80% of Max Heartrate 65-131    Ratings of Perceived Exertion 11-13    Perceived Dyspnea 0-4      Progression   Progression Continue progressive overload as per policy without signs/symptoms or physical distress.      Resistance Training   Training Prescription Yes    Weight 4    Reps 10-15             Perform Capillary Blood Glucose checks as needed.  Exercise Prescription Changes:   Exercise Comments:   Exercise Goals and Review:   Exercise Goals     Row Name 01/04/23 1313             Exercise Goals   Increase Physical Activity Yes       Intervention Provide advice, education, support and counseling about physical activity/exercise needs.;Develop an individualized exercise prescription for aerobic and resistive training based on initial evaluation findings, risk stratification, comorbidities and participant's personal goals.       Expected Outcomes Short Term: Attend rehab on a regular basis to increase amount of physical activity.;Long Term: Exercising regularly at least 3-5 days a week.;Long Term: Add in home exercise to make exercise part of routine and to increase amount of physical activity.       Increase Strength and Stamina Yes       Intervention Provide advice, education, support and counseling about physical activity/exercise needs.;Develop an  individualized exercise prescription for aerobic and resistive  training based on initial evaluation findings, risk stratification, comorbidities and participant's personal goals.       Expected Outcomes Short Term: Increase workloads from initial exercise prescription for resistance, speed, and METs.;Short Term: Perform resistance training exercises routinely during rehab and add in resistance training at home;Long Term: Improve cardiorespiratory fitness, muscular endurance and strength as measured by increased METs and functional capacity ( )       Able to understand and use rate of perceived exertion (RPE) scale Yes       Intervention Provide education and explanation on how to use RPE scale       Expected Outcomes Short Term: Able to use RPE daily in rehab to express subjective intensity level;Long Term:  Able to use RPE to guide intensity level when exercising independently       Knowledge and understanding of Target Heart Rate Range (THRR) Yes       Intervention Provide education and explanation of THRR including how the numbers were predicted and where they are located for reference       Expected Outcomes Short Term: Able to state/look up THRR;Long Term: Able to use THRR to govern intensity when exercising independently;Short Term: Able to use daily as guideline for intensity in rehab       Understanding of Exercise Prescription Yes       Intervention Provide education, explanation, and written materials on patient's individual exercise prescription       Expected Outcomes Short Term: Able to explain program exercise prescription;Long Term: Able to explain home exercise prescription to exercise independently                Exercise Goals Re-Evaluation :   Discharge Exercise Prescription (Final Exercise Prescription Changes):   Nutrition:  Target Goals: Understanding of nutrition guidelines, daily intake of sodium 1500mg , cholesterol 200mg , calories 30% from fat and 7% or less  from saturated fats, daily to have 5 or more servings of fruits and vegetables.  Biometrics:  Pre Biometrics - 01/04/23 1314       Pre Biometrics   Height 5\' 10"  (1.778 m)    Weight 109.5 kg    Waist Circumference 46 inches    Hip Circumference 40.25 inches    Waist to Hip Ratio 1.14 %    BMI (Calculated) 34.64    Triceps Skinfold 12 mm    % Body Fat 31.4 %    Grip Strength 42 kg    Flexibility 11.75 in    Single Leg Stand 30 seconds              Nutrition Therapy Plan and Nutrition Goals:   Nutrition Assessments:  MEDIFICTS Score Key: >=70 Need to make dietary changes  40-70 Heart Healthy Diet <= 40 Therapeutic Level Cholesterol Diet    Picture Your Plate Scores: <16 Unhealthy dietary pattern with much room for improvement. 41-50 Dietary pattern unlikely to meet recommendations for good health and room for improvement. 51-60 More healthful dietary pattern, with some room for improvement.  >60 Healthy dietary pattern, although there may be some specific behaviors that could be improved.    Nutrition Goals Re-Evaluation:   Nutrition Goals Re-Evaluation:   Nutrition Goals Discharge (Final Nutrition Goals Re-Evaluation):   Psychosocial: Target Goals: Acknowledge presence or absence of significant depression and/or stress, maximize coping skills, provide positive support system. Participant is able to verbalize types and ability to use techniques and skills needed for reducing stress and depression.  Initial Review & Psychosocial Screening:  Initial  Psych Review & Screening - 01/04/23 1329       Initial Review   Current issues with Current Sleep Concerns   Some muscle aches, and chages in sleeping pattrers, but not due to stress or anxiety     Family Dynamics   Good Support System? Yes   Wife and daughter are a good support system     Barriers   Psychosocial barriers to participate in program There are no identifiable barriers or psychosocial needs.;The  patient should benefit from training in stress management and relaxation.      Screening Interventions   Interventions Encouraged to exercise;Provide feedback about the scores to participant    Expected Outcomes Long Term Goal: Stressors or current issues are controlled or eliminated.;Short Term goal: Identification and review with participant of any Quality of Life or Depression concerns found by scoring the questionnaire.;Long Term goal: The participant improves quality of Life and PHQ9 Scores as seen by post scores and/or verbalization of changes             Quality of Life Scores:  Quality of Life - 01/04/23 1316       Quality of Life   Select Quality of Life      Quality of Life Scores   Health/Function Pre 27.1 %    Socioeconomic Pre 27.36 %    Psych/Spiritual Pre 29.14 %    Family Pre 28.8 %    GLOBAL Pre 27.82 %            Scores of 19 and below usually indicate a poorer quality of life in these areas.  A difference of  2-3 points is a clinically meaningful difference.  A difference of 2-3 points in the total score of the Quality of Life Index has been associated with significant improvement in overall quality of life, self-image, physical symptoms, and general health in studies assessing change in quality of life.  PHQ-9: Review Flowsheet       01/04/2023  Depression screen PHQ 2/9  Decreased Interest 0  Down, Depressed, Hopeless 0  PHQ - 2 Score 0  Altered sleeping 1  Tired, decreased energy 0  Change in appetite 0  Feeling bad or failure about yourself  0  Trouble concentrating 0  Moving slowly or fidgety/restless 0  Suicidal thoughts 0  PHQ-9 Score 1    Details           Interpretation of Total Score  Total Score Depression Severity:  1-4 = Minimal depression, 5-9 = Mild depression, 10-14 = Moderate depression, 15-19 = Moderately severe depression, 20-27 = Severe depression   Psychosocial Evaluation and Intervention:   Psychosocial  Re-Evaluation:   Psychosocial Discharge (Final Psychosocial Re-Evaluation):   Vocational Rehabilitation: Provide vocational rehab assistance to qualifying candidates.   Vocational Rehab Evaluation & Intervention:  Vocational Rehab - 01/04/23 1323       Initial Vocational Rehab Evaluation & Intervention   Assessment shows need for Vocational Rehabilitation No   No needs, he is back at work            Education: Education Goals: Education classes will be provided on a weekly basis, covering required topics. Participant will state understanding/return demonstration of topics presented.     Core Videos: Exercise    Move It!  Clinical staff conducted group or individual video education with verbal and written material and guidebook.  Patient learns the recommended Pritikin exercise program. Exercise with the goal of living a long, healthy life. Some of  the health benefits of exercise include controlled diabetes, healthier blood pressure levels, improved cholesterol levels, improved heart and lung capacity, improved sleep, and better body composition. Everyone should speak with their doctor before starting or changing an exercise routine.  Biomechanical Limitations Clinical staff conducted group or individual video education with verbal and written material and guidebook.  Patient learns how biomechanical limitations can impact exercise and how we can mitigate and possibly overcome limitations to have an impactful and balanced exercise routine.  Body Composition Clinical staff conducted group or individual video education with verbal and written material and guidebook.  Patient learns that body composition (ratio of muscle mass to fat mass) is a key component to assessing overall fitness, rather than body weight alone. Increased fat mass, especially visceral belly fat, can put [place] at increased risk for metabolic syndrome, type 2 diabetes, heart disease, and even death. It is  recommended to combine diet and exercise (cardiovascular and resistance training) to improve your body composition. Seek guidance from your physician and exercise physiologist before implementing an exercise routine.  Exercise Action Plan Clinical staff conducted group or individual video education with verbal and written material and guidebook.  Patient learns the recommended strategies to achieve and enjoy long-term exercise adherence, including variety, self-motivation, self-efficacy, and positive decision making. Benefits of exercise include fitness, good health, weight management, more energy, better sleep, less stress, and overall well-being.  Medical   Heart Disease Risk Reduction Clinical staff conducted group or individual video education with verbal and written material and guidebook.  Patient learns our heart is our most vital organ as it circulates oxygen, nutrients, white blood cells, and hormones throughout the entire body, and carries waste away. Data supports a plant-based eating plan like the Pritikin Program for its effectiveness in slowing progression of and reversing heart disease. The video provides a number of recommendations to address heart disease.   Metabolic Syndrome and Belly Fat  Clinical staff conducted group or individual video education with verbal and written material and guidebook.  Patient learns what metabolic syndrome is, how it leads to heart disease, and how one can reverse it and keep it from coming back. You have metabolic syndrome if you have 3 of the following 5 criteria: abdominal obesity, high blood pressure, high triglycerides, low HDL cholesterol, and high blood sugar.  Hypertension and Heart Disease Clinical staff conducted group or individual video education with verbal and written material and guidebook.  Patient learns that high blood pressure, or hypertension, is very common in the [place]. Hypertension is largely due to excessive salt  intake, but other important risk factors include being overweight, physical inactivity, drinking too much alcohol, smoking, and not eating enough potassium from fruits and vegetables. High blood pressure is a leading risk factor for heart attack, stroke, congestive heart failure, dementia, kidney failure, and premature death. Long-term effects of excessive salt intake include stiffening of the arteries and thickening of heart muscle and organ damage. Recommendations include ways to reduce hypertension and the risk of heart disease.  Diseases of Our Time - Focusing on Diabetes Clinical staff conducted group or individual video education with verbal and written material and guidebook.  Patient learns why the best way to stop diseases of our time is prevention, through food and other lifestyle changes. Medicine (such as prescription pills and surgeries) is often only a Band-Aid on the problem, not a long-term solution. Most common diseases of our time include obesity, type 2 diabetes, hypertension, heart disease, and cancer. The  Pritikin Program is recommended and has been proven to help reduce, reverse, and/or prevent the damaging effects of metabolic syndrome.  Nutrition   Overview of the Pritikin Eating Plan  Clinical staff conducted group or individual video education with verbal and written material and guidebook.  Patient learns about the Pritikin Eating Plan for disease risk reduction. The Pritikin Eating Plan emphasizes a wide variety of unrefined, minimally-processed carbohydrates, like fruits, vegetables, whole grains, and legumes. Go, Caution, and Stop food choices are explained. Plant-based and lean animal proteins are emphasized. Rationale provided for low sodium intake for blood pressure control, low added sugars for blood sugar stabilization, and low added fats and oils for coronary artery disease risk reduction and weight management.  Calorie Density  Clinical staff conducted group or  individual video education with verbal and written material and guidebook.  Patient learns about calorie density and how it impacts the Pritikin Eating Plan. Knowing the characteristics of the food you choose will help you decide whether those foods will lead to weight gain or weight loss, and whether you want to consume more or less of them. Weight loss is usually a side effect of the Pritikin Eating Plan because of its focus on low calorie-dense foods.  Label Reading  Clinical staff conducted group or individual video education with verbal and written material and guidebook.  Patient learns about the Pritikin recommended label reading guidelines and corresponding recommendations regarding calorie density, added sugars, sodium content, and whole grains.  Dining Out - Part 1  Clinical staff conducted group or individual video education with verbal and written material and guidebook.  Patient learns that restaurant meals can be sabotaging because they can be so high in calories, fat, sodium, and/or sugar. Patient learns recommended strategies on how to positively address this and avoid unhealthy pitfalls.  Facts on Fats  Clinical staff conducted group or individual video education with verbal and written material and guidebook.  Patient learns that lifestyle modifications can be just as effective, if not more so, as many medications for lowering your risk of heart disease. A Pritikin lifestyle can help to reduce your risk of inflammation and atherosclerosis (cholesterol build-up, or plaque, in the artery walls). Lifestyle interventions such as dietary choices and physical activity address the cause of atherosclerosis. A review of the types of fats and their impact on blood cholesterol levels, along with dietary recommendations to reduce fat intake is also included.  Nutrition Action Plan  Clinical staff conducted group or individual video education with verbal and written material and guidebook.   Patient learns how to incorporate Pritikin recommendations into their lifestyle. Recommendations include planning and keeping personal health goals in mind as an important part of their success.  Healthy Mind-Set    Healthy Minds, Bodies, Hearts  Clinical staff conducted group or individual video education with verbal and written material and guidebook.  Patient learns how to identify when they are stressed. Video will discuss the impact of that stress, as well as the many benefits of stress management. Patient will also be introduced to stress management techniques. The way we think, act, and feel has an impact on our hearts.  How Our Thoughts Can Heal Our Hearts  Clinical staff conducted group or individual video education with verbal and written material and guidebook.  Patient learns that negative thoughts can cause depression and anxiety. This can result in negative lifestyle behavior and serious health problems. Cognitive behavioral therapy is an effective method to help control our thoughts in order  to change and improve our emotional outlook.  Additional Videos:  Exercise    Improving Performance  Clinical staff conducted group or individual video education with verbal and written material and guidebook.  Patient learns to use a non-linear approach by alternating intensity levels and lengths of time spent exercising to help burn more calories and lose more body fat. Cardiovascular exercise helps improve heart health, metabolism, hormonal balance, blood sugar control, and recovery from fatigue. Resistance training improves strength, endurance, balance, coordination, reaction time, metabolism, and muscle mass. Flexibility exercise improves circulation, posture, and balance. Seek guidance from your physician and exercise physiologist before implementing an exercise routine and learn your capabilities and proper form for all exercise.  Introduction to Yoga  Clinical staff conducted group or  individual video education with verbal and written material and guidebook.  Patient learns about yoga, a discipline of the coming together of mind, breath, and body. The benefits of yoga include improved flexibility, improved range of motion, better posture and core strength, increased lung function, weight loss, and positive self-image. Yoga's heart health benefits include lowered blood pressure, healthier heart rate, decreased cholesterol and triglyceride levels, improved immune function, and reduced stress. Seek guidance from your physician and exercise physiologist before implementing an exercise routine and learn your capabilities and proper form for all exercise.  Medical   Aging: Enhancing Your Quality of Life  Clinical staff conducted group or individual video education with verbal and written material and guidebook.  Patient learns key strategies and recommendations to stay in good physical health and enhance quality of life, such as prevention strategies, having an advocate, securing a Health Care Proxy and Power of Attorney, and keeping a list of medications and system for tracking them. It also discusses how to avoid risk for bone loss.  Biology of Weight Control  Clinical staff conducted group or individual video education with verbal and written material and guidebook.  Patient learns that weight gain occurs because we consume more calories than we burn (eating more, moving less). Even if your body weight is normal, you may have higher ratios of fat compared to muscle mass. Too much body fat puts you at increased risk for cardiovascular disease, heart attack, stroke, type 2 diabetes, and obesity-related cancers. In addition to exercise, following the Pritikin Eating Plan can help reduce your risk.  Decoding Lab Results  Clinical staff conducted group or individual video education with verbal and written material and guidebook.  Patient learns that lab test reflects one measurement whose  values change over time and are influenced by many factors, including medication, stress, sleep, exercise, food, hydration, pre-existing medical conditions, and more. It is recommended to use the knowledge from this video to become more involved with your lab results and evaluate your numbers to speak with your doctor.   Diseases of Our Time - Overview  Clinical staff conducted group or individual video education with verbal and written material and guidebook.  Patient learns that according to the CDC, 50% to 70% of chronic diseases (such as obesity, type 2 diabetes, elevated lipids, hypertension, and heart disease) are avoidable through lifestyle improvements including healthier food choices, listening to satiety cues, and increased physical activity.  Sleep Disorders Clinical staff conducted group or individual video education with verbal and written material and guidebook.  Patient learns how good quality and duration of sleep are important to overall health and well-being. Patient also learns about sleep disorders and how they impact health along with recommendations to address them, including  discussing with a physician.  Nutrition  Dining Out - Part 2 Clinical staff conducted group or individual video education with verbal and written material and guidebook.  Patient learns how to plan ahead and communicate in order to maximize their dining experience in a healthy and nutritious manner. Included are recommended food choices based on the type of restaurant the patient is visiting.   Fueling a Banker conducted group or individual video education with verbal and written material and guidebook.  There is a strong connection between our food choices and our health. Diseases like obesity and type 2 diabetes are very prevalent and are in large-part due to lifestyle choices. The Pritikin Eating Plan provides plenty of food and hunger-curbing satisfaction. It is easy to follow,  affordable, and helps reduce health risks.  Menu Workshop  Clinical staff conducted group or individual video education with verbal and written material and guidebook.  Patient learns that restaurant meals can sabotage health goals because they are often packed with calories, fat, sodium, and sugar. Recommendations include strategies to plan ahead and to communicate with the manager, chef, or server to help order a healthier meal.  Planning Your Eating Strategy  Clinical staff conducted group or individual video education with verbal and written material and guidebook.  Patient learns about the Pritikin Eating Plan and its benefit of reducing the risk of disease. The Pritikin Eating Plan does not focus on calories. Instead, it emphasizes high-quality, nutrient-rich foods. By knowing the characteristics of the foods, we choose, we can determine their calorie density and make informed decisions.  Targeting Your Nutrition Priorities  Clinical staff conducted group or individual video education with verbal and written material and guidebook.  Patient learns that lifestyle habits have a tremendous impact on disease risk and progression. This video provides eating and physical activity recommendations based on your personal health goals, such as reducing LDL cholesterol, losing weight, preventing or controlling type 2 diabetes, and reducing high blood pressure.  Vitamins and Minerals  Clinical staff conducted group or individual video education with verbal and written material and guidebook.  Patient learns different ways to obtain key vitamins and minerals, including through a recommended healthy diet. It is important to discuss all supplements you take with your doctor.   Healthy Mind-Set    Smoking Cessation  Clinical staff conducted group or individual video education with verbal and written material and guidebook.  Patient learns that cigarette smoking and tobacco addiction pose a serious health  risk which affects millions of people. Stopping smoking will significantly reduce the risk of heart disease, lung disease, and many forms of cancer. Recommended strategies for quitting are covered, including working with your doctor to develop a successful plan.  Culinary   Becoming a Set designer conducted group or individual video education with verbal and written material and guidebook.  Patient learns that cooking at home can be healthy, cost-effective, quick, and puts them in control. Keys to cooking healthy recipes will include looking at your recipe, assessing your equipment needs, planning ahead, making it simple, choosing cost-effective seasonal ingredients, and limiting the use of added fats, salts, and sugars.  Cooking - Breakfast and Snacks  Clinical staff conducted group or individual video education with verbal and written material and guidebook.  Patient learns how important breakfast is to satiety and nutrition through the entire day. Recommendations include key foods to eat during breakfast to help stabilize blood sugar levels and to prevent overeating at meals  later in the day. Planning ahead is also a key component.  Cooking - Educational psychologist conducted group or individual video education with verbal and written material and guidebook.  Patient learns eating strategies to improve overall health, including an approach to cook more at home. Recommendations include thinking of animal protein as a side on your plate rather than center stage and focusing instead on lower calorie dense options like vegetables, fruits, whole grains, and plant-based proteins, such as beans. Making sauces in large quantities to freeze for later and leaving the skin on your vegetables are also recommended to maximize your experience.  Cooking - Healthy Salads and Dressing Clinical staff conducted group or individual video education with verbal and written material and  guidebook.  Patient learns that vegetables, fruits, whole grains, and legumes are the foundations of the Pritikin Eating Plan. Recommendations include how to incorporate each of these in flavorful and healthy salads, and how to create homemade salad dressings. Proper handling of ingredients is also covered. Cooking - Soups and [employer] - Soups and Desserts Clinical staff conducted group or individual video education with verbal and written material and guidebook.  Patient learns that Pritikin soups and desserts make for easy, nutritious, and delicious snacks and meal components that are low in sodium, fat, sugar, and calorie density, while high in vitamins, minerals, and filling fiber. Recommendations include simple and healthy ideas for soups and desserts.   Overview     The Pritikin Solution Program Overview Clinical staff conducted group or individual video education with verbal and written material and guidebook.  Patient learns that the results of the Pritikin Program have been documented in more than 100 articles published in peer-reviewed journals, and the benefits include reducing risk factors for (and, in some cases, even reversing) high cholesterol, high blood pressure, type 2 diabetes, obesity, and more! An overview of the three key pillars of the Pritikin Program will be covered: eating well, doing regular exercise, and having a healthy mind-set.  WORKSHOPS  Exercise: Exercise Basics: Building Your Action Plan Clinical staff led group instruction and group discussion with PowerPoint presentation and patient guidebook. To enhance the learning environment the use of posters, models and videos may be added. At the conclusion of this workshop, patients will comprehend the difference between physical activity and exercise, as well as the benefits of incorporating both, into their routine. Patients will understand the FITT (Frequency, Intensity, Time, and Type) principle and how to  use it to build an exercise action plan. In addition, safety concerns and other considerations for exercise and cardiac rehab will be addressed by the presenter. The purpose of this lesson is to promote a comprehensive and effective weekly exercise routine in order to improve patients' overall level of fitness.   Managing Heart Disease: Your Path to a Healthier Heart Clinical staff led group instruction and group discussion with PowerPoint presentation and patient guidebook. To enhance the learning environment the use of posters, models and videos may be added.At the conclusion of this workshop, patients will understand the anatomy and physiology of the heart. Additionally, they will understand how Pritikin's three pillars impact the risk factors, the progression, and the management of heart disease.  The purpose of this lesson is to provide a high-level overview of the heart, heart disease, and how the Pritikin lifestyle positively impacts risk factors.  Exercise Biomechanics Clinical staff led group instruction and group discussion with PowerPoint presentation and patient guidebook. To enhance the learning environment  the use of posters, models and videos may be added. Patients will learn how the structural parts of their bodies function and how these functions impact their daily activities, movement, and exercise. Patients will learn how to promote a neutral spine, learn how to manage pain, and identify ways to improve their physical movement in order to promote healthy living. The purpose of this lesson is to expose patients to common physical limitations that impact physical activity. Participants will learn practical ways to adapt and manage aches and pains, and to minimize their effect on regular exercise. Patients will learn how to maintain good posture while sitting, walking, and lifting.  Balance Training and Fall Prevention  Clinical staff led group instruction and group discussion  with PowerPoint presentation and patient guidebook. To enhance the learning environment the use of posters, models and videos may be added. At the conclusion of this workshop, patients will understand the importance of their sensorimotor skills (vision, proprioception, and the vestibular system) in maintaining their ability to balance as they age. Patients will apply a variety of balancing exercises that are appropriate for their current level of function. Patients will understand the common causes for poor balance, possible solutions to these problems, and ways to modify their physical environment in order to minimize their fall risk. The purpose of this lesson is to teach patients about the importance of maintaining balance as they age and ways to minimize their risk of falling.  WORKSHOPS   Nutrition:  Fueling a Ship broker led group instruction and group discussion with PowerPoint presentation and patient guidebook. To enhance the learning environment the use of posters, models and videos may be added. Patients will review the foundational principles of the Pritikin Eating Plan and understand what constitutes a serving size in each of the food groups. Patients will also learn Pritikin-friendly foods that are better choices when away from home and review make-ahead meal and snack options. Calorie density will be reviewed and applied to three nutrition priorities: weight maintenance, weight loss, and weight gain. The purpose of this lesson is to reinforce (in a group setting) the key concepts around what patients are recommended to eat and how to apply these guidelines when away from home by planning and selecting Pritikin-friendly options. Patients will understand how calorie density may be adjusted for different weight management goals.  Mindful Eating  Clinical staff led group instruction and group discussion with PowerPoint presentation and patient guidebook. To enhance the  learning environment the use of posters, models and videos may be added. Patients will briefly review the concepts of the Pritikin Eating Plan and the importance of low-calorie dense foods. The concept of mindful eating will be introduced as well as the importance of paying attention to internal hunger signals. Triggers for non-hunger eating and techniques for dealing with triggers will be explored. The purpose of this lesson is to provide patients with the opportunity to review the basic principles of the Pritikin Eating Plan, discuss the value of eating mindfully and how to measure internal cues of hunger and fullness using the Hunger Scale. Patients will also discuss reasons for non-hunger eating and learn strategies to use for controlling emotional eating.  Targeting Your Nutrition Priorities Clinical staff led group instruction and group discussion with PowerPoint presentation and patient guidebook. To enhance the learning environment the use of posters, models and videos may be added. Patients will learn how to determine their genetic susceptibility to disease by reviewing their family history. Patients will gain insight  into the importance of diet as part of an overall healthy lifestyle in mitigating the impact of genetics and other environmental insults. The purpose of this lesson is to provide patients with the opportunity to assess their personal nutrition priorities by looking at their family history, their own health history and current risk factors. Patients will also be able to discuss ways of prioritizing and modifying the Pritikin Eating Plan for their highest risk areas  Menu  Clinical staff led group instruction and group discussion with PowerPoint presentation and patient guidebook. To enhance the learning environment the use of posters, models and videos may be added. Using menus brought in from [employer], or printed from [employer], patients will apply the Pritikin dining out  guidelines that were presented in the [employer] video. Patients will also be able to practice these guidelines in a variety of provided scenarios. The purpose of this lesson is to provide patients with the opportunity to practice hands-on learning of the Pritikin Dining Out guidelines with actual menus and practice scenarios.  Label Reading Clinical staff led group instruction and group discussion with PowerPoint presentation and patient guidebook. To enhance the learning environment the use of posters, models and videos may be added. Patients will review and discuss the Pritikin label reading guidelines presented in Pritikin's Label Reading Educational series video. Using fool labels brought in from local grocery stores and markets, patients will apply the label reading guidelines and determine if the packaged food meet the Pritikin guidelines. The purpose of this lesson is to provide patients with the opportunity to review, discuss, and practice hands-on learning of the Pritikin Label Reading guidelines with actual packaged food labels. Cooking School  Pritikin's [employer] are designed to teach patients ways to prepare quick, simple, and affordable recipes at home. The importance of nutrition's role in chronic disease risk reduction is reflected in its emphasis in the overall Pritikin program. By learning how to prepare essential core Pritikin Eating Plan recipes, patients will increase control over what they eat; be able to customize the flavor of foods without the use of added salt, sugar, or fat; and improve the quality of the food they consume. By learning a set of core recipes which are easily assembled, quickly prepared, and affordable, patients are more likely to prepare more healthy foods at home. These workshops focus on convenient breakfasts, simple entres, side dishes, and desserts which can be prepared with minimal effort and are consistent with nutrition  recommendations for cardiovascular risk reduction. Cooking [employer] are taught by a Armed forces logistics/support/administrative officer (RD) who has been trained by the [employer]. The chef or RD has a clear understanding of the importance of minimizing - if not completely eliminating - added fat, sugar, and sodium in recipes. Throughout the series of Cooking School Workshop sessions, patients will learn about healthy ingredients and efficient methods of cooking to build confidence in their capability to prepare    Cooking School weekly topics:  Adding Flavor- Sodium-Free  Fast and Healthy Breakfasts  Powerhouse Plant-Based Proteins  Satisfying Salads and Dressings  Simple Sides and Sauces  International Cuisine-Spotlight on the [employer] Zones  Delicious Desserts  Savory Soups  [employer] - Meals in a Astronomer Appetizers and Snacks  Comforting Weekend Breakfasts  One-Pot Wonders   Fast Evening Meals  Landscape architect Your Pritikin Plate  WORKSHOPS   Healthy Mindset (Psychosocial):  Focused Goals, Sustainable Changes Clinical staff led group instruction  and group discussion with PowerPoint presentation and patient guidebook. To enhance the learning environment the use of posters, models and videos may be added. Patients will be able to apply effective goal setting strategies to establish at least one personal goal, and then take consistent, meaningful action toward that goal. They will learn to identify common barriers to achieving personal goals and develop strategies to overcome them. Patients will also gain an understanding of how our mind-set can impact our ability to achieve goals and the importance of cultivating a positive and growth-oriented mind-set. The purpose of this lesson is to provide patients with a deeper understanding of how to set and achieve personal goals, as well as the tools and strategies needed to overcome common obstacles which may arise along  the way.  From Head to Heart: The Power of a Healthy Outlook  Clinical staff led group instruction and group discussion with PowerPoint presentation and patient guidebook. To enhance the learning environment the use of posters, models and videos may be added. Patients will be able to recognize and describe the impact of emotions and mood on physical health. They will discover the importance of self-care and explore self-care practices which may work for them. Patients will also learn how to utilize the 4 C's to cultivate a healthier outlook and better manage stress and challenges. The purpose of this lesson is to demonstrate to patients how a healthy outlook is an essential part of maintaining good health, especially as they continue their cardiac rehab journey.  Healthy Sleep for a Healthy Heart Clinical staff led group instruction and group discussion with PowerPoint presentation and patient guidebook. To enhance the learning environment the use of posters, models and videos may be added. At the conclusion of this workshop, patients will be able to demonstrate knowledge of the importance of sleep to overall health, well-being, and quality of life. They will understand the symptoms of, and treatments for, common sleep disorders. Patients will also be able to identify daytime and nighttime behaviors which impact sleep, and they will be able to apply these tools to help manage sleep-related challenges. The purpose of this lesson is to provide patients with a general overview of sleep and outline the importance of quality sleep. Patients will learn about a few of the most common sleep disorders. Patients will also be introduced to the concept of "sleep hygiene," and discover ways to self-manage certain sleeping problems through simple daily behavior changes. Finally, the workshop will motivate patients by clarifying the links between quality sleep and their goals of heart-healthy living.   Recognizing and  Reducing Stress Clinical staff led group instruction and group discussion with PowerPoint presentation and patient guidebook. To enhance the learning environment the use of posters, models and videos may be added. At the conclusion of this workshop, patients will be able to understand the types of stress reactions, differentiate between acute and chronic stress, and recognize the impact that chronic stress has on their health. They will also be able to apply different coping mechanisms, such as reframing negative self-talk. Patients will have the opportunity to practice a variety of stress management techniques, such as deep abdominal breathing, progressive muscle relaxation, and/or guided imagery.  The purpose of this lesson is to educate patients on the role of stress in their lives and to provide healthy techniques for coping with it.  Learning Barriers/Preferences:  Learning Barriers/Preferences - 01/04/23 1317       Learning Barriers/Preferences   Learning Barriers None    Learning  Preferences Pictoral;Video;Written Material;Computer/Internet             Education Topics:  Knowledge Questionnaire Score:  Knowledge Questionnaire Score - 01/04/23 1322       Knowledge Questionnaire Score   Pre Score 18/24             Core Components/Risk Factors/Patient Goals at Admission:  Personal Goals and Risk Factors at Admission - 01/04/23 1324       Core Components/Risk Factors/Patient Goals on Admission    Weight Management Yes;Obesity;Weight Loss    Intervention Weight Management: Develop a combined nutrition and exercise program designed to reach desired caloric intake, while maintaining appropriate intake of nutrient and fiber, sodium and fats, and appropriate energy expenditure required for the weight goal.;Weight Management: Provide education and appropriate resources to help participant work on and attain dietary goals.;Weight Management/Obesity: Establish reasonable short term  and long term weight goals.;Obesity: Provide education and appropriate resources to help participant work on and attain dietary goals.    Admit Weight 241 lb 6.5 oz (109.5 kg)    Expected Outcomes Short Term: Continue to assess and modify interventions until short term weight is achieved;Long Term: Adherence to nutrition and physical activity/exercise program aimed toward attainment of established weight goal;Weight Loss: Understanding of general recommendations for a balanced deficit meal plan, which promotes 1-2 lb weight loss per week and includes a negative energy balance of [phone] kcal/d;Understanding recommendations for meals to include 15-35% energy as protein, 25-35% energy from fat, 35-60% energy from carbohydrates, less than 200mg  of dietary cholesterol, 20-35 gm of total fiber daily;Understanding of distribution of calorie intake throughout the day with the consumption of 4-5 meals/snacks    Hypertension Yes    Intervention Provide education on lifestyle modifcations including regular physical activity/exercise, weight management, moderate sodium restriction and increased consumption of fresh fruit, vegetables, and low fat dairy, alcohol moderation, and smoking cessation.;Monitor prescription use compliance.    Expected Outcomes Short Term: Continued assessment and intervention until BP is < 140/31mm HG in hypertensive participants. < 130/30mm HG in hypertensive participants with diabetes, heart failure or chronic kidney disease.;Long Term: Maintenance of blood pressure at goal levels.    Lipids Yes    Intervention Provide education and support for participant on nutrition & aerobic/resistive exercise along with prescribed medications to achieve LDL 70mg , HDL >40mg .    Expected Outcomes Short Term: Participant states understanding of desired cholesterol values and is compliant with medications prescribed. Participant is following exercise prescription and nutrition guidelines.;Long Term:  Cholesterol controlled with medications as prescribed, with individualized exercise RX and with personalized nutrition plan. Value goals: LDL < 70mg , HDL > 40 mg.    Personal Goal Other Yes    Personal Goal Short: consult RD, Ex routine Long: Wt loss, stamina flexibility    Intervention Will continue to monitor pt and progress workloads as tolerated without sign or symptom    Expected Outcomes Pt will achieve his goals             Core Components/Risk Factors/Patient Goals Review:    Core Components/Risk Factors/Patient Goals at Discharge (Final Review):    ITP Comments:  ITP Comments     Row Name 01/04/23 1058           ITP Comments Dr. Armanda Magic medical director. Introduction to pritikin education/intensive cardiac rehab. Initial orientation packet reviewed with patient.                Comments: Participant attended orientation for the cardiac rehabilitation program  on  01/04/2023  to perform initial intake and exercise walk test. Patient introduced to the Pritikin Program education and orientation packet was reviewed. Completed 6-minute walk test, measurements, initial ITP, and exercise prescription. Vital signs stable. BP mildly elevated at rest 148/86. Nurse aware. Telemetry-normal sinus rhythm with occasional PVC's, mildly symptomatic post walk test with throat tightness/fullness, see nurse note in EPIC. ECG obtained. Symptoms resolved.   Service time was from 0827 to 1100.

## 2023-01-04 NOTE — Telephone Encounter (Signed)
A staff from rehab hung up while they were calling triage.  Call back to rehab to speak with her and she is with a patient and cannot take the call.  Asked if patient was still being seen and staff could not tell me if he was or not.  Call to patient and LM to call office

## 2023-01-04 NOTE — Telephone Encounter (Signed)
Andrew Price called to state Dr Antoine Poche was available at rehab and assisted with patient . Nothing needed from our office at this time

## 2023-01-08 ENCOUNTER — Encounter: Payer: Self-pay | Admitting: *Deleted

## 2023-01-08 ENCOUNTER — Encounter (HOSPITAL_COMMUNITY)
Admission: RE | Admit: 2023-01-08 | Discharge: 2023-01-08 | Disposition: A | Discharge status: Home / Self Care | Payer: 59 | Source: Ambulatory Visit | Attending: Cardiology | Admitting: Cardiology

## 2023-01-08 DIAGNOSIS — Z955 Presence of coronary angioplasty implant and graft: Secondary | ICD-10-CM

## 2023-01-08 DIAGNOSIS — Z48812 Encounter for surgical aftercare following surgery on the circulatory system: Secondary | ICD-10-CM | POA: Diagnosis not present

## 2023-01-08 NOTE — Progress Notes (Signed)
Daily Session Note  Patient Details  Name: LASHAUN HYMON MRN: [id number] Date of Birth: 01-Dec-1965 Referring Provider:   Flowsheet Row INTENSIVE CARDIAC REHAB ORIENT from 01/04/2023 in [facility]  Referring Provider Dr. Rollene Rotunda MD       Encounter Date: 01/08/2023  Check In:  Session Check In - 01/08/23 0708       Check-In   Supervising physician immediately available to respond to emergencies [facility] MD immediately available    Physician(s) Joni Reining, NP    Location [facility]    Staff Present Raford Pitcher, MS, ACSM-CEP, Exercise Physiologist;Bailey Wallace Cullens, MS, Exercise Physiologist;Allante Whitmire Remus Loffler, RN, MHA;Jetta Walker BS, ACSM-CEP, Exercise Physiologist;Johnny Hale Bogus, MS, Exercise Physiologist    Virtual Visit No    Medication changes reported     No    Fall or balance concerns reported    No    Warm-up and Cool-down Performed as group-led instruction    Resistance Training Performed Yes    VAD Patient? No    PAD/SET Patient? No      Pain Assessment   Currently in Pain? No/denies    Pain Score 0-No pain    Multiple Pain Sites No             Capillary Blood Glucose: No results found for this or any previous visit (from the past 24 hour(s)).   Exercise Prescription Changes - 01/08/23 0845       Response to Exercise   Blood Pressure (Admit) 128/82    Blood Pressure (Exercise) 140/68    Blood Pressure (Exit) 122/70    Heart Rate (Admit) 72 bpm    Heart Rate (Exercise) 97 bpm    Heart Rate (Exit) 77 bpm    Rating of Perceived Exertion (Exercise) 9.5    Perceived Dyspnea (Exercise) 0    Symptoms 0    Comments Pt first day in the [facility] program    Duration Progress to 30 minutes of  aerobic without signs/symptoms of physical distress    Intensity THRR unchanged      Progression   Progression Continue to progress workloads to maintain intensity without  signs/symptoms of physical distress.    Average METs 1.9      Resistance Training   Training Prescription Yes    Weight 4    Reps 10-15    Time 10 Minutes      Recumbant Bike   Level 2    RPM 80    Watts 19    Minutes 15    METs 2.2      Arm Ergometer   Level 2    Minutes 15    METs 1.6             Social History   Tobacco Use  Smoking Status Never  Smokeless Tobacco Not on file    Goals Met:  Independence with exercise equipment Exercise tolerated well No report of concerns or symptoms today Strength training completed today  Goals Unmet:  Not Applicable  Comments: Pt in for cardiac rehab group sessions today. Pt tolerated light exercise without difficulty. VSS, telemetry-Sinus Rhythm. Medication list reconciled at orientation and reports no changes. Pt denies barriers to medication compliance.  PSYCHOSOCIAL ASSESSMENT:  PHQ9=1. Pt exhibits positive coping skills, hopeful outlook with supportive family. No psychosocial needs identified at this time, no psychosocial interventions necessary.  Pt oriented to exercise equipment and gym routine. Understanding verbalized.  Dr. Armanda Magic is Medical Director for Cardiac Rehab at [facility].

## 2023-01-10 ENCOUNTER — Encounter (HOSPITAL_COMMUNITY)
Admission: RE | Admit: 2023-01-10 | Discharge: 2023-01-10 | Disposition: A | Discharge status: Home / Self Care | Payer: 59 | Source: Ambulatory Visit | Attending: Cardiology | Admitting: Cardiology

## 2023-01-10 DIAGNOSIS — Z48812 Encounter for surgical aftercare following surgery on the circulatory system: Secondary | ICD-10-CM | POA: Diagnosis not present

## 2023-01-10 DIAGNOSIS — Z955 Presence of coronary angioplasty implant and graft: Secondary | ICD-10-CM

## 2023-01-12 ENCOUNTER — Encounter (HOSPITAL_COMMUNITY)
Admission: RE | Admit: 2023-01-12 | Discharge: 2023-01-12 | Disposition: A | Discharge status: Home / Self Care | Payer: 59 | Source: Ambulatory Visit | Attending: Cardiology | Admitting: Cardiology

## 2023-01-12 DIAGNOSIS — Z48812 Encounter for surgical aftercare following surgery on the circulatory system: Secondary | ICD-10-CM | POA: Diagnosis not present

## 2023-01-12 DIAGNOSIS — Z955 Presence of coronary angioplasty implant and graft: Secondary | ICD-10-CM

## 2023-01-15 ENCOUNTER — Encounter (HOSPITAL_COMMUNITY): Payer: 59

## 2023-01-17 ENCOUNTER — Encounter (HOSPITAL_COMMUNITY): Payer: 59

## 2023-01-18 ENCOUNTER — Ambulatory Visit: Payer: 59 | Admitting: Student

## 2023-01-18 NOTE — Progress Notes (Signed)
Cardiac Individual Treatment Plan  Patient Details  Name: Andrew Price MRN: [id number] Date of Birth: 1965-07-14 Referring Provider:   Flowsheet Row INTENSIVE CARDIAC REHAB ORIENT from 01/04/2023 in [facility]  Referring Provider Dr. Rollene Rotunda MD       Initial Encounter Date:  Flowsheet Row INTENSIVE CARDIAC REHAB ORIENT from 01/04/2023 in [facility]  Date 01/04/23       Visit Diagnosis: 12/21/22 DES LAD  Patient's Home Medications on Admission:  Current Outpatient Medications:    aspirin EC (ASPIRIN EC ADULT LOW DOSE) 81 MG tablet, Take 1 tablet (81 mg total) by mouth daily. Swallow whole., Disp: 90 tablet, Rfl: 3   clopidogrel (PLAVIX) 75 MG tablet, Take 1 tablet (75 mg total) by mouth daily with breakfast., Disp: 90 tablet, Rfl: 3   colchicine 0.6 MG tablet, Take 0.6 mg by mouth daily as needed (gout)., Disp: , Rfl:    icosapent Ethyl (VASCEPA) 1 g capsule, Take 2 capsules (2 g total) by mouth 2 (two) times daily., Disp: 120 capsule, Rfl: 11   lisinopril (ZESTRIL) 10 MG tablet, Take 1 tablet (10 mg total) by mouth daily., Disp: 90 tablet, Rfl: 3   loratadine (CLARITIN) 10 MG tablet, Take 10 mg by mouth daily as needed for allergies., Disp: , Rfl:    Multiple Vitamins-Minerals (MULTIVITAMIN WITH MINERALS) tablet, Take 1 tablet by mouth daily. Men 50+, Disp: , Rfl:    neomycin-polymyxin-dexameth (MAXITROL) 0.1 % OINT, Place 1 Application into both eyes once a week. Eye lid, Disp: , Rfl:    nitroGLYCERIN (NITROSTAT) 0.4 MG SL tablet, Place 1 tablet (0.4 mg total) under the tongue every 5 (five) minutes as needed for chest pain., Disp: 25 tablet, Rfl: 2   rosuvastatin (CRESTOR) 40 MG tablet, Take 1 tablet (40 mg total) by mouth daily., Disp: 90 tablet, Rfl: 3  Past Medical History: Past Medical History:  Diagnosis Date   Sullivan Lone syndrome    Gout     Hypertension    Mild sleep apnea    Obesity (BMI 30-39.9)     Tobacco Use: Social History   Tobacco Use  Smoking Status Never  Smokeless Tobacco Not on file    Labs: Review Flowsheet       Latest Ref Rng & Units 12/20/2022  Labs for ITP Cardiac and Pulmonary Rehab  Hemoglobin A1c 4.8 - 5.6 % 6.1     Details            Capillary Blood Glucose: Lab Results  Component Value Date   GLUCAP 211 (H) 08/07/2022   GLUCAP 236 (H) 08/06/2022   GLUCAP 225 (H) 08/06/2022   GLUCAP 193 (H) 08/05/2022     Exercise Target Goals: Exercise Program Goal: Individual exercise prescription set using results from initial 6 min walk test and THRR while considering  patient's activity barriers and safety.   Exercise Prescription Goal: Initial exercise prescription builds to 30-45 minutes a day of aerobic activity, 2-3 days per week.  Home exercise guidelines will be given to patient during program as part of exercise prescription that the participant will acknowledge.  Activity Barriers & Risk Stratification:  Activity Barriers & Cardiac Risk Stratification - 01/04/23 1307       Activity Barriers & Cardiac Risk Stratification   Activity Barriers Back Problems;Deconditioning    Cardiac Risk Stratification High   under 5 MET's  6 Minute Walk:  6 Minute Walk     Row Name 01/04/23 1250         6 Minute Walk   Phase Initial     Distance 1610 feet     Walk Time 6 minutes     # of Rest Breaks 0     MPH 3.04     METS 4.03     RPE 8     Perceived Dyspnea  0     VO2 Peak 14.12     Symptoms No     Resting HR 66 bpm     Resting BP 148/86     Resting Oxygen Saturation  99 %     Exercise Oxygen Saturation  during 6 min walk 100 %     Max Ex. HR 113 bpm     Max Ex. BP 150/86     2 Minute Post BP 144/84              Oxygen Initial Assessment:   Oxygen Re-Evaluation:   Oxygen Discharge (Final Oxygen Re-Evaluation):   Initial Exercise  Prescription:  Initial Exercise Prescription - 01/04/23 1300       Date of Initial Exercise RX and Referring Provider   Date 01/04/23    Referring Provider Dr. Rollene Rotunda MD    Expected Discharge Date 03/26/23      Recumbant Bike   Level 2    RPM 60    Watts 25    Minutes 15    METs 2      Arm Ergometer   Level 2    Watts 15    RPM 45    Minutes 15    METs 1.9      Prescription Details   Frequency (times per week) 3    Duration Progress to 30 minutes of continuous aerobic without signs/symptoms of physical distress      Intensity   THRR 40-80% of Max Heartrate 65-131    Ratings of Perceived Exertion 11-13    Perceived Dyspnea 0-4      Progression   Progression Continue progressive overload as per policy without signs/symptoms or physical distress.      Resistance Training   Training Prescription Yes    Weight 4    Reps 10-15             Perform Capillary Blood Glucose checks as needed.  Exercise Prescription Changes:   Exercise Prescription Changes     Row Name 01/08/23 0845             Response to Exercise   Blood Pressure (Admit) 128/82       Blood Pressure (Exercise) 140/68       Blood Pressure (Exit) 122/70       Heart Rate (Admit) 72 bpm       Heart Rate (Exercise) 97 bpm       Heart Rate (Exit) 77 bpm       Rating of Perceived Exertion (Exercise) 9.5       Perceived Dyspnea (Exercise) 0       Symptoms 0       Comments Pt first day in the Prit[facility]in ICR program       Duration Progress to 30 minutes of  aerobic without signs/symptoms of physical distress       Intensity THRR unchanged         Progression   Progression Continue to progress workloads to maintain intensity without signs/symptoms of physical  distress.       Average METs 1.9         Resistance Training   Training Prescription Yes       Weight 4       Reps 10-15       Time 10 Minutes         Recumbant Bike   Level 2       RPM 80       Watts 19       Minutes 15        METs 2.2         Arm Ergometer   Level 2       Minutes 15       METs 1.6                Exercise Comments:   Exercise Comments     Row Name 01/08/23 0851           Exercise Comments Pt first day in the Prit[facility]in ICR program. Pt tolerated exercise well with an average MET level of 1.9. Pt is learning his THRR, RPE and ExRx. Off to a great start.                Exercise Goals and Review:   Exercise Goals     Row Name 01/04/23 1313             Exercise Goals   Increase Physical Activity Yes       Intervention Provide advice, education, support and counseling about physical activity/exercise needs.;Develop an individualized exercise prescription for aerobic and resistive training based on initial evaluation findings, risk stratification, comorbidities and participant's personal goals.       Expected Outcomes Short Term: Attend rehab on a regular basis to increase amount of physical activity.;Long Term: Exercising regularly at least 3-5 days a week.;Long Term: Add in home exercise to make exercise part of routine and to increase amount of physical activity.       Increase Strength and Stamina Yes       Intervention Provide advice, education, support and counseling about physical activity/exercise needs.;Develop an individualized exercise prescription for aerobic and resistive training based on initial evaluation findings, risk stratification, comorbidities and participant's personal goals.       Expected Outcomes Short Term: Increase workloads from initial exercise prescription for resistance, speed, and METs.;Short Term: Perform resistance training exercises routinely during rehab and add in resistance training at home;Long Term: Improve cardiorespiratory fitness, muscular endurance and strength as measured by increased METs and functional capacity ( )       Able to understand and use rate of perceived exertion (RPE) scale Yes       Intervention Provide education and  explanation on how to use RPE scale       Expected Outcomes Short Term: Able to use RPE daily in rehab to express subjective intensity level;Long Term:  Able to use RPE to guide intensity level when exercising independently       Knowledge and understanding of Target Heart Rate Range (THRR) Yes       Intervention Provide education and explanation of THRR including how the numbers were predicted and where they are located for reference       Expected Outcomes Short Term: Able to state/look up THRR;Long Term: Able to use THRR to govern intensity when exercising independently;Short Term: Able to use daily as guideline for intensity in rehab       Understanding of Exercise Prescription  Yes       Intervention Provide education, explanation, and written materials on patient's individual exercise prescription       Expected Outcomes Short Term: Able to explain program exercise prescription;Long Term: Able to explain home exercise prescription to exercise independently                Exercise Goals Re-Evaluation :  Exercise Goals Re-Evaluation     Row Name 01/08/23 0850             Exercise Goal Re-Evaluation   Exercise Goals Review Increase Physical Activity;Understanding of Exercise Prescription;Increase Strength and Stamina;Knowledge and understanding of Target Heart Rate Range (THRR);Able to understand and use rate of perceived exertion (RPE) scale       Comments Pt first day in the P[facility]in ICR program. Pt tolerated exercise well with an average MET level of 1.9. Pt is learning his THRR, RPE and ExRx. Off to a great start.       Expected Outcomes Will continue to monitor pt and progress workloads as tolerated without sign or symptom                Discharge Exercise Prescription (Final Exercise Prescription Changes):  Exercise Prescription Changes - 01/08/23 0845       Response to Exercise   Blood Pressure (Admit) 128/82    Blood Pressure (Exercise) 140/68    Blood Pressure  (Exit) 122/70    Heart Rate (Admit) 72 bpm    Heart Rate (Exercise) 97 bpm    Heart Rate (Exit) 77 bpm    Rating of Perceived Exertion (Exercise) 9.5    Perceived Dyspnea (Exercise) 0    Symptoms 0    Comments Pt first day in the [facility] ICR program    Duration Progress to 30 minutes of  aerobic without signs/symptoms of physical distress    Intensity THRR unchanged      Progression   Progression Continue to progress workloads to maintain intensity without signs/symptoms of physical distress.    Average METs 1.9      Resistance Training   Training Prescription Yes    Weight 4    Reps 10-15    Time 10 Minutes      Recumbant Bike   Level 2    RPM 80    Watts 19    Minutes 15    METs 2.2      Arm Ergometer   Level 2    Minutes 15    METs 1.6             Nutrition:  Target Goals: Understanding of nutrition guidelines, daily intake of sodium 1500mg , cholesterol 200mg , calories 30% from fat and 7% or less from saturated fats, daily to have 5 or more servings of fruits and vegetables.  Biometrics:  Pre Biometrics - 01/04/23 1314       Pre Biometrics   Height 5\' 10"  (1.778 m)    Weight 109.5 kg    Waist Circumference 46 inches    Hip Circumference 40.25 inches    Waist to Hip Ratio 1.14 %    BMI (Calculated) 34.64    Triceps Skinfold 12 mm    % Body Fat 31.4 %    Grip Strength 42 kg    Flexibility 11.75 in    Single Leg Stand 30 seconds              Nutrition Therapy Plan and Nutrition Goals:  Nutrition Therapy & Goals - 01/08/23 1016  Nutrition Therapy   Diet Heart healthy diet    Drug/Food Interactions Statins/Certain Fruits      Personal Nutrition Goals   Nutrition Goal Patient to identify strategies for reducing cardiovascular risk by attending the [facility] education and nutrition series weekly.    Personal Goal #2 Patient to improve diet quality by using the plate method as a guide for meal planning to include lean protein/plant  protein, fruits, vegetables, whole grains, nonfat dairy as part of a well-balanced diet.    Personal Goal #3 Patient to reduce sodium intake to 1500-2000mg  per day.    Comments [name] has medical history of stent placement, hyperlipidemia, HTN,obesity. He has been started on Vascepa for elevated triglycerides. A1c remains in a pre-diabetic range. Patient will benefit from participation in intensive cardiac rehab for nutrition, exercise, and lifestyle modification.      Intervention Plan   Intervention Nutrition handout(s) given to patient.;Prescribe, educate and counsel regarding individualized specific dietary modifications aiming towards targeted core components such as weight, hypertension, lipid management, diabetes, heart failure and other comorbidities.    Expected Outcomes Short Term Goal: Understand basic principles of dietary content, such as calories, fat, sodium, cholesterol and nutrients.;Long Term Goal: Adherence to prescribed nutrition plan.             Nutrition Assessments:  Nutrition Assessments - 01/05/23 1418       Rate Your Plate Scores   Pre Score 73            MEDIFICTS Score Key: >=70 Need to make dietary changes  40-70 Heart Healthy Diet <= 40 Therapeutic Level Cholesterol Diet   Flowsheet Row INTENSIVE CARDIAC REHAB ORIENT from 01/04/2023 in [facility]  Picture Your Plate Total Score on Admission 73      Picture Your Plate Scores: <69 Unhealthy dietary pattern with much room for improvement. 41-50 Dietary pattern unlikely to meet recommendations for good health and room for improvement. 51-60 More healthful dietary pattern, with some room for improvement.  >60 Healthy dietary pattern, although there may be some specific behaviors that could be improved.    Nutrition Goals Re-Evaluation:  Nutrition Goals Re-Evaluation     Row Name 01/08/23 1016             Goals   Current Weight 241  lb 6.5 oz (109.5 kg)       Comment A1c 6.1, cholestrol 191, HDL 31, LDL 61, triglycerides 663       Expected Outcome [name] has medical history of stent placement, hyperlipidemia, HTN,obesity. He has been started on Vascepa for elevated triglycerides. A1c remains in a pre-diabetic range. Patient will benefit from participation in intensive cardiac rehab for nutrition, exercise, and lifestyle modification.                Nutrition Goals Re-Evaluation:  Nutrition Goals Re-Evaluation     Row Name 01/08/23 1016             Goals   Current Weight 241 lb 6.5 oz (109.5 kg)       Comment A1c 6.1, cholestrol 191, HDL 31, LDL 61, triglycerides 663       Expected Outcome [name] has medical history of stent placement, hyperlipidemia, HTN,obesity. He has been started on Vascepa for elevated triglycerides. A1c remains in a pre-diabetic range. Patient will benefit from participation in intensive cardiac rehab for nutrition, exercise, and lifestyle modification.  Nutrition Goals Discharge (Final Nutrition Goals Re-Evaluation):  Nutrition Goals Re-Evaluation - 01/08/23 1016       Goals   Current Weight 241 lb 6.5 oz (109.5 kg)    Comment A1c 6.1, cholestrol 191, HDL 31, LDL 61, triglycerides 663    Expected Outcome [name] has medical history of stent placement, hyperlipidemia, HTN,obesity. He has been started on Vascepa for elevated triglycerides. A1c remains in a pre-diabetic range. Patient will benefit from participation in intensive cardiac rehab for nutrition, exercise, and lifestyle modification.             Psychosocial: Target Goals: Acknowledge presence or absence of significant depression and/or stress, maximize coping skills, provide positive support system. Participant is able to verbalize types and ability to use techniques and skills needed for reducing stress and depression.  Initial Review & Psychosocial Screening:  Initial Psych Review & Screening - 01/04/23  1329       Initial Review   Current issues with Current Sleep Concerns   Some muscle aches, and chages in sleeping pattrers, but not due to stress or anxiety     Family Dynamics   Good Support System? Yes   Wife and daughter are a good support system     Barriers   Psychosocial barriers to participate in program There are no identifiable barriers or psychosocial needs.;The patient should benefit from training in stress management and relaxation.      Screening Interventions   Interventions Encouraged to exercise;Provide feedback about the scores to participant    Expected Outcomes Long Term Goal: Stressors or current issues are controlled or eliminated.;Short Term goal: Identification and review with participant of any Quality of Life or Depression concerns found by scoring the questionnaire.;Long Term goal: The participant improves quality of Life and PHQ9 Scores as seen by post scores and/or verbalization of changes             Quality of Life Scores:  Quality of Life - 01/04/23 1316       Quality of Life   Select Quality of Life      Quality of Life Scores   Health/Function Pre 27.1 %    Socioeconomic Pre 27.36 %    Psych/Spiritual Pre 29.14 %    Family Pre 28.8 %    GLOBAL Pre 27.82 %            Scores of 19 and below usually indicate a poorer quality of life in these areas.  A difference of  2-3 points is a clinically meaningful difference.  A difference of 2-3 points in the total score of the Quality of Life Index has been associated with significant improvement in overall quality of life, self-image, physical symptoms, and general health in studies assessing change in quality of life.  PHQ-9: Review Flowsheet       01/04/2023  Depression screen PHQ 2/9  Decreased Interest 0  Down, Depressed, Hopeless 0  PHQ - 2 Score 0  Altered sleeping 1  Tired, decreased energy 0  Change in appetite 0  Feeling bad or failure about yourself  0  Trouble concentrating 0   Moving slowly or fidgety/restless 0  Suicidal thoughts 0  PHQ-9 Score 1    Details           Interpretation of Total Score  Total Score Depression Severity:  1-4 = Minimal depression, 5-9 = Mild depression, 10-14 = Moderate depression, 15-19 = Moderately severe depression, 20-27 = Severe depression   Psychosocial Evaluation and Intervention:  Psychosocial Re-Evaluation:  Psychosocial Re-Evaluation     Row Name 01/18/23 1124             Psychosocial Re-Evaluation   Current issues with None Identified       Interventions Encouraged to attend Cardiac Rehabilitation for the exercise       Continue Psychosocial Services  No Follow up required                Psychosocial Discharge (Final Psychosocial Re-Evaluation):  Psychosocial Re-Evaluation - 01/18/23 1124       Psychosocial Re-Evaluation   Current issues with None Identified    Interventions Encouraged to attend Cardiac Rehabilitation for the exercise    Continue Psychosocial Services  No Follow up required             Vocational Rehabilitation: Provide vocational rehab assistance to qualifying candidates.   Vocational Rehab Evaluation & Intervention:  Vocational Rehab - 01/04/23 1323       Initial Vocational Rehab Evaluation & Intervention   Assessment shows need for Vocational Rehabilitation No   No needs, he is back at work            Education: Education Goals: Education classes will be provided on a weekly basis, covering required topics. Participant will state understanding/return demonstration of topics presented.    Education     Row Name 01/10/23 1100     Education   Cardiac Education Topics Pritikin   Customer service manager   Weekly Topic Comforting Weekend Breakfasts   Instruction Review Code 1- Verbalizes Understanding   Class Start Time [phone]   Class Stop Time 0900   Class Time Calculation (min) 45 min    Row Name 01/19/23 0900      Education   Cardiac Education Topics [facility]in   Secondary school teacher School   Educator Dietitian   Weekly Topic Fast Evening Meals   Instruction Review Code 1- Verbalizes Understanding   Class Start Time 0815   Class Stop Time 0850   Class Time Calculation (min) 35 min            Core Videos: Exercise    Move It!  Clinical staff conducted group or individual video education with verbal and written material and guidebook.  Patient learns the recommended Pritikin exercise program. Exercise with the goal of living a long, healthy life. Some of the health benefits of exercise include controlled diabetes, healthier blood pressure levels, improved cholesterol levels, improved heart and lung capacity, improved sleep, and better body composition. Everyone should speak with their doctor before starting or changing an exercise routine.  Biomechanical Limitations Clinical staff conducted group or individual video education with verbal and written material and guidebook.  Patient learns how biomechanical limitations can impact exercise and how we can mitigate and possibly overcome limitations to have an impactful and balanced exercise routine.  Body Composition Clinical staff conducted group or individual video education with verbal and written material and guidebook.  Patient learns that body composition (ratio of muscle mass to fat mass) is a key component to assessing overall fitness, rather than body weight alone. Increased fat mass, especially visceral belly fat, can put [place] at increased risk for metabolic syndrome, type 2 diabetes, heart disease, and even death. It is recommended to combine diet and exercise (cardiovascular and resistance training) to improve your body composition. Seek guidance from your physician and exercise physiologist before implementing  an exercise routine.  Exercise Action Plan Clinical staff conducted group or individual video education with  verbal and written material and guidebook.  Patient learns the recommended strategies to achieve and enjoy long-term exercise adherence, including variety, self-motivation, self-efficacy, and positive decision making. Benefits of exercise include fitness, good health, weight management, more energy, better sleep, less stress, and overall well-being.  Medical   Heart Disease Risk Reduction Clinical staff conducted group or individual video education with verbal and written material and guidebook.  Patient learns our heart is our most vital organ as it circulates oxygen, nutrients, white blood cells, and hormones throughout the entire body, and carries waste away. Data supports a plant-based eating plan like the Pritikin Program for its effectiveness in slowing progression of and reversing heart disease. The video provides a number of recommendations to address heart disease.   Metabolic Syndrome and Belly Fat  Clinical staff conducted group or individual video education with verbal and written material and guidebook.  Patient learns what metabolic syndrome is, how it leads to heart disease, and how one can reverse it and keep it from coming back. You have metabolic syndrome if you have 3 of the following 5 criteria: abdominal obesity, high blood pressure, high triglycerides, low HDL cholesterol, and high blood sugar.  Hypertension and Heart Disease Clinical staff conducted group or individual video education with verbal and written material and guidebook.  Patient learns that high blood pressure, or hypertension, is very common in the [place]. Hypertension is largely due to excessive salt intake, but other important risk factors include being overweight, physical inactivity, drinking too much alcohol, smoking, and not eating enough potassium from fruits and vegetables. High blood pressure is a leading risk factor for heart attack, stroke, congestive heart failure, dementia, kidney failure, and  premature death. Long-term effects of excessive salt intake include stiffening of the arteries and thickening of heart muscle and organ damage. Recommendations include ways to reduce hypertension and the risk of heart disease.  Diseases of Our Time - Focusing on Diabetes Clinical staff conducted group or individual video education with verbal and written material and guidebook.  Patient learns why the best way to stop diseases of our time is prevention, through food and other lifestyle changes. Medicine (such as prescription pills and surgeries) is often only a Band-Aid on the problem, not a long-term solution. Most common diseases of our time include obesity, type 2 diabetes, hypertension, heart disease, and cancer. The Pritikin Program is recommended and has been proven to help reduce, reverse, and/or prevent the damaging effects of metabolic syndrome.  Nutrition   Overview of the Pritikin Eating Plan  Clinical staff conducted group or individual video education with verbal and written material and guidebook.  Patient learns about the Pritikin Eating Plan for disease risk reduction. The Pritikin Eating Plan emphasizes a wide variety of unrefined, minimally-processed carbohydrates, like fruits, vegetables, whole grains, and legumes. Go, Caution, and Stop food choices are explained. Plant-based and lean animal proteins are emphasized. Rationale provided for low sodium intake for blood pressure control, low added sugars for blood sugar stabilization, and low added fats and oils for coronary artery disease risk reduction and weight management.  Calorie Density  Clinical staff conducted group or individual video education with verbal and written material and guidebook.  Patient learns about calorie density and how it impacts the Pritikin Eating Plan. Knowing the characteristics of the food you choose will help you decide whether those foods will lead to weight gain or  weight loss, and whether you want to  consume more or less of them. Weight loss is usually a side effect of the Pritikin Eating Plan because of its focus on low calorie-dense foods.  Label Reading  Clinical staff conducted group or individual video education with verbal and written material and guidebook.  Patient learns about the Pritikin recommended label reading guidelines and corresponding recommendations regarding calorie density, added sugars, sodium content, and whole grains.  Dining Out - Part 1  Clinical staff conducted group or individual video education with verbal and written material and guidebook.  Patient learns that restaurant meals can be sabotaging because they can be so high in calories, fat, sodium, and/or sugar. Patient learns recommended strategies on how to positively address this and avoid unhealthy pitfalls.  Facts on Fats  Clinical staff conducted group or individual video education with verbal and written material and guidebook.  Patient learns that lifestyle modifications can be just as effective, if not more so, as many medications for lowering your risk of heart disease. A Pritikin lifestyle can help to reduce your risk of inflammation and atherosclerosis (cholesterol build-up, or plaque, in the artery walls). Lifestyle interventions such as dietary choices and physical activity address the cause of atherosclerosis. A review of the types of fats and their impact on blood cholesterol levels, along with dietary recommendations to reduce fat intake is also included.  Nutrition Action Plan  Clinical staff conducted group or individual video education with verbal and written material and guidebook.  Patient learns how to incorporate Pritikin recommendations into their lifestyle. Recommendations include planning and keeping personal health goals in mind as an important part of their success.  Healthy Mind-Set    Healthy Minds, Bodies, Hearts  Clinical staff conducted group or individual video education with  verbal and written material and guidebook.  Patient learns how to identify when they are stressed. Video will discuss the impact of that stress, as well as the many benefits of stress management. Patient will also be introduced to stress management techniques. The way we think, act, and feel has an impact on our hearts.  How Our Thoughts Can Heal Our Hearts  Clinical staff conducted group or individual video education with verbal and written material and guidebook.  Patient learns that negative thoughts can cause depression and anxiety. This can result in negative lifestyle behavior and serious health problems. Cognitive behavioral therapy is an effective method to help control our thoughts in order to change and improve our emotional outlook.  Additional Videos:  Exercise    Improving Performance  Clinical staff conducted group or individual video education with verbal and written material and guidebook.  Patient learns to use a non-linear approach by alternating intensity levels and lengths of time spent exercising to help burn more calories and lose more body fat. Cardiovascular exercise helps improve heart health, metabolism, hormonal balance, blood sugar control, and recovery from fatigue. Resistance training improves strength, endurance, balance, coordination, reaction time, metabolism, and muscle mass. Flexibility exercise improves circulation, posture, and balance. Seek guidance from your physician and exercise physiologist before implementing an exercise routine and learn your capabilities and proper form for all exercise.  Introduction to Yoga  Clinical staff conducted group or individual video education with verbal and written material and guidebook.  Patient learns about yoga, a discipline of the coming together of mind, breath, and body. The benefits of yoga include improved flexibility, improved range of motion, better posture and core strength, increased lung function, weight loss, and  positive self-image. Yoga's heart health benefits include lowered blood pressure, healthier heart rate, decreased cholesterol and triglyceride levels, improved immune function, and reduced stress. Seek guidance from your physician and exercise physiologist before implementing an exercise routine and learn your capabilities and proper form for all exercise.  Medical   Aging: Enhancing Your Quality of Life  Clinical staff conducted group or individual video education with verbal and written material and guidebook.  Patient learns key strategies and recommendations to stay in good physical health and enhance quality of life, such as prevention strategies, having an advocate, securing a Health Care Proxy and Power of Attorney, and keeping a list of medications and system for tracking them. It also discusses how to avoid risk for bone loss.  Biology of Weight Control  Clinical staff conducted group or individual video education with verbal and written material and guidebook.  Patient learns that weight gain occurs because we consume more calories than we burn (eating more, moving less). Even if your body weight is normal, you may have higher ratios of fat compared to muscle mass. Too much body fat puts you at increased risk for cardiovascular disease, heart attack, stroke, type 2 diabetes, and obesity-related cancers. In addition to exercise, following the Pritikin Eating Plan can help reduce your risk.  Decoding Lab Results  Clinical staff conducted group or individual video education with verbal and written material and guidebook.  Patient learns that lab test reflects one measurement whose values change over time and are influenced by many factors, including medication, stress, sleep, exercise, food, hydration, pre-existing medical conditions, and more. It is recommended to use the knowledge from this video to become more involved with your lab results and evaluate your numbers to speak with your  doctor.   Diseases of Our Time - Overview  Clinical staff conducted group or individual video education with verbal and written material and guidebook.  Patient learns that according to the CDC, 50% to 70% of chronic diseases (such as obesity, type 2 diabetes, elevated lipids, hypertension, and heart disease) are avoidable through lifestyle improvements including healthier food choices, listening to satiety cues, and increased physical activity.  Sleep Disorders Clinical staff conducted group or individual video education with verbal and written material and guidebook.  Patient learns how good quality and duration of sleep are important to overall health and well-being. Patient also learns about sleep disorders and how they impact health along with recommendations to address them, including discussing with a physician.  Nutrition  Dining Out - Part 2 Clinical staff conducted group or individual video education with verbal and written material and guidebook.  Patient learns how to plan ahead and communicate in order to maximize their dining experience in a healthy and nutritious manner. Included are recommended food choices based on the type of restaurant the patient is visiting.   Fueling a Banker conducted group or individual video education with verbal and written material and guidebook.  There is a strong connection between our food choices and our health. Diseases like obesity and type 2 diabetes are very prevalent and are in large-part due to lifestyle choices. The Pritikin Eating Plan provides plenty of food and hunger-curbing satisfaction. It is easy to follow, affordable, and helps reduce health risks.  Menu Workshop  Clinical staff conducted group or individual video education with verbal and written material and guidebook.  Patient learns that restaurant meals can sabotage health goals because they are often packed with calories, fat, sodium, and sugar.  Recommendations include strategies to plan ahead and to communicate with the manager, chef, or server to help order a healthier meal.  Planning Your Eating Strategy  Clinical staff conducted group or individual video education with verbal and written material and guidebook.  Patient learns about the Pritikin Eating Plan and its benefit of reducing the risk of disease. The Pritikin Eating Plan does not focus on calories. Instead, it emphasizes high-quality, nutrient-rich foods. By knowing the characteristics of the foods, we choose, we can determine their calorie density and make informed decisions.  Targeting Your Nutrition Priorities  Clinical staff conducted group or individual video education with verbal and written material and guidebook.  Patient learns that lifestyle habits have a tremendous impact on disease risk and progression. This video provides eating and physical activity recommendations based on your personal health goals, such as reducing LDL cholesterol, losing weight, preventing or controlling type 2 diabetes, and reducing high blood pressure.  Vitamins and Minerals  Clinical staff conducted group or individual video education with verbal and written material and guidebook.  Patient learns different ways to obtain key vitamins and minerals, including through a recommended healthy diet. It is important to discuss all supplements you take with your doctor.   Healthy Mind-Set    Smoking Cessation  Clinical staff conducted group or individual video education with verbal and written material and guidebook.  Patient learns that cigarette smoking and tobacco addiction pose a serious health risk which affects millions of people. Stopping smoking will significantly reduce the risk of heart disease, lung disease, and many forms of cancer. Recommended strategies for quitting are covered, including working with your doctor to develop a successful plan.  Culinary   Becoming a Corporate investment banker conducted group or individual video education with verbal and written material and guidebook.  Patient learns that cooking at home can be healthy, cost-effective, quick, and puts them in control. Keys to cooking healthy recipes will include looking at your recipe, assessing your equipment needs, planning ahead, making it simple, choosing cost-effective seasonal ingredients, and limiting the use of added fats, salts, and sugars.  Cooking - Breakfast and Snacks  Clinical staff conducted group or individual video education with verbal and written material and guidebook.  Patient learns how important breakfast is to satiety and nutrition through the entire day. Recommendations include key foods to eat during breakfast to help stabilize blood sugar levels and to prevent overeating at meals later in the day. Planning ahead is also a key component.  Cooking - Educational psychologist conducted group or individual video education with verbal and written material and guidebook.  Patient learns eating strategies to improve overall health, including an approach to cook more at home. Recommendations include thinking of animal protein as a side on your plate rather than center stage and focusing instead on lower calorie dense options like vegetables, fruits, whole grains, and plant-based proteins, such as beans. Making sauces in large quantities to freeze for later and leaving the skin on your vegetables are also recommended to maximize your experience.  Cooking - Healthy Salads and Dressing Clinical staff conducted group or individual video education with verbal and written material and guidebook.  Patient learns that vegetables, fruits, whole grains, and legumes are the foundations of the Pritikin Eating Plan. Recommendations include how to incorporate each of these in flavorful and healthy salads, and how to create homemade salad dressings. Proper handling of ingredients is also covered.  Cooking - Soups and  Desserts  Cooking - Soups and Desserts Clinical staff conducted group or individual video education with verbal and written material and guidebook.  Patient learns that Pritikin soups and desserts make for easy, nutritious, and delicious snacks and meal components that are low in sodium, fat, sugar, and calorie density, while high in vitamins, minerals, and filling fiber. Recommendations include simple and healthy ideas for soups and desserts.   Overview     The Pritikin Solution Program Overview Clinical staff conducted group or individual video education with verbal and written material and guidebook.  Patient learns that the results of the Pritikin Program have been documented in more than 100 articles published in peer-reviewed journals, and the benefits include reducing risk factors for (and, in some cases, even reversing) high cholesterol, high blood pressure, type 2 diabetes, obesity, and more! An overview of the three key pillars of the Pritikin Program will be covered: eating well, doing regular exercise, and having a healthy mind-set.  WORKSHOPS  Exercise: Exercise Basics: Building Your Action Plan Clinical staff led group instruction and group discussion with PowerPoint presentation and patient guidebook. To enhance the learning environment the use of posters, models and videos may be added. At the conclusion of this workshop, patients will comprehend the difference between physical activity and exercise, as well as the benefits of incorporating both, into their routine. Patients will understand the FITT (Frequency, Intensity, Time, and Type) principle and how to use it to build an exercise action plan. In addition, safety concerns and other considerations for exercise and cardiac rehab will be addressed by the presenter. The purpose of this lesson is to promote a comprehensive and effective weekly exercise routine in order to improve patients' overall level of  fitness.   Managing Heart Disease: Your Path to a Healthier Heart Clinical staff led group instruction and group discussion with PowerPoint presentation and patient guidebook. To enhance the learning environment the use of posters, models and videos may be added.At the conclusion of this workshop, patients will understand the anatomy and physiology of the heart. Additionally, they will understand how Pritikin's three pillars impact the risk factors, the progression, and the management of heart disease.  The purpose of this lesson is to provide a high-level overview of the heart, heart disease, and how the Pritikin lifestyle positively impacts risk factors.  Exercise Biomechanics Clinical staff led group instruction and group discussion with PowerPoint presentation and patient guidebook. To enhance the learning environment the use of posters, models and videos may be added. Patients will learn how the structural parts of their bodies function and how these functions impact their daily activities, movement, and exercise. Patients will learn how to promote a neutral spine, learn how to manage pain, and identify ways to improve their physical movement in order to promote healthy living. The purpose of this lesson is to expose patients to common physical limitations that impact physical activity. Participants will learn practical ways to adapt and manage aches and pains, and to minimize their effect on regular exercise. Patients will learn how to maintain good posture while sitting, walking, and lifting.  Balance Training and Fall Prevention  Clinical staff led group instruction and group discussion with PowerPoint presentation and patient guidebook. To enhance the learning environment the use of posters, models and videos may be added. At the conclusion of this workshop, patients will understand the importance of their sensorimotor skills (vision, proprioception, and the vestibular system)  in maintaining their ability to balance as they age. Patients will apply  a variety of balancing exercises that are appropriate for their current level of function. Patients will understand the common causes for poor balance, possible solutions to these problems, and ways to modify their physical environment in order to minimize their fall risk. The purpose of this lesson is to teach patients about the importance of maintaining balance as they age and ways to minimize their risk of falling.  WORKSHOPS   Nutrition:  Fueling a Ship broker led group instruction and group discussion with PowerPoint presentation and patient guidebook. To enhance the learning environment the use of posters, models and videos may be added. Patients will review the foundational principles of the Pritikin Eating Plan and understand what constitutes a serving size in each of the food groups. Patients will also learn Pritikin-friendly foods that are better choices when away from home and review make-ahead meal and snack options. Calorie density will be reviewed and applied to three nutrition priorities: weight maintenance, weight loss, and weight gain. The purpose of this lesson is to reinforce (in a group setting) the key concepts around what patients are recommended to eat and how to apply these guidelines when away from home by planning and selecting Pritikin-friendly options. Patients will understand how calorie density may be adjusted for different weight management goals.  Mindful Eating  Clinical staff led group instruction and group discussion with PowerPoint presentation and patient guidebook. To enhance the learning environment the use of posters, models and videos may be added. Patients will briefly review the concepts of the Pritikin Eating Plan and the importance of low-calorie dense foods. The concept of mindful eating will be introduced as well as the importance of paying attention to internal hunger  signals. Triggers for non-hunger eating and techniques for dealing with triggers will be explored. The purpose of this lesson is to provide patients with the opportunity to review the basic principles of the Pritikin Eating Plan, discuss the value of eating mindfully and how to measure internal cues of hunger and fullness using the Hunger Scale. Patients will also discuss reasons for non-hunger eating and learn strategies to use for controlling emotional eating.  Targeting Your Nutrition Priorities Clinical staff led group instruction and group discussion with PowerPoint presentation and patient guidebook. To enhance the learning environment the use of posters, models and videos may be added. Patients will learn how to determine their genetic susceptibility to disease by reviewing their family history. Patients will gain insight into the importance of diet as part of an overall healthy lifestyle in mitigating the impact of genetics and other environmental insults. The purpose of this lesson is to provide patients with the opportunity to assess their personal nutrition priorities by looking at their family history, their own health history and current risk factors. Patients will also be able to discuss ways of prioritizing and modifying the Pritikin Eating Plan for their highest risk areas  Menu  Clinical staff led group instruction and group discussion with PowerPoint presentation and patient guidebook. To enhance the learning environment the use of posters, models and videos may be added. Using menus brought in from [employer], or printed from [employer], patients will apply the Pritikin dining out guidelines that were presented in the [employer] video. Patients will also be able to practice these guidelines in a variety of provided scenarios. The purpose of this lesson is to provide patients with the opportunity to practice hands-on learning of the Pritikin Dining Out guidelines  with actual menus and practice  scenarios.  Label Reading Clinical staff led group instruction and group discussion with PowerPoint presentation and patient guidebook. To enhance the learning environment the use of posters, models and videos may be added. Patients will review and discuss the Pritikin label reading guidelines presented in Pritikin's Label Reading Educational series video. Using fool labels brought in from local grocery stores and markets, patients will apply the label reading guidelines and determine if the packaged food meet the Pritikin guidelines. The purpose of this lesson is to provide patients with the opportunity to review, discuss, and practice hands-on learning of the Pritikin Label Reading guidelines with actual packaged food labels. Cooking School  Pritikin's [employer] are designed to teach patients ways to prepare quick, simple, and affordable recipes at home. The importance of nutrition's role in chronic disease risk reduction is reflected in its emphasis in the overall Pritikin program. By learning how to prepare essential core Pritikin Eating Plan recipes, patients will increase control over what they eat; be able to customize the flavor of foods without the use of added salt, sugar, or fat; and improve the quality of the food they consume. By learning a set of core recipes which are easily assembled, quickly prepared, and affordable, patients are more likely to prepare more healthy foods at home. These workshops focus on convenient breakfasts, simple entres, side dishes, and desserts which can be prepared with minimal effort and are consistent with nutrition recommendations for cardiovascular risk reduction. Cooking [employer] are taught by a Armed forces logistics/support/administrative officer (RD) who has been trained by the [employer]. The chef or RD has a clear understanding of the importance of minimizing - if not completely eliminating - added fat, sugar, and  sodium in recipes. Throughout the series of Cooking School Workshop sessions, patients will learn about healthy ingredients and efficient methods of cooking to build confidence in their capability to prepare    Cooking School weekly topics:  Adding Flavor- Sodium-Free  Fast and Healthy Breakfasts  Powerhouse Plant-Based Proteins  Satisfying Salads and Dressings  Simple Sides and Sauces  International Cuisine-Spotlight on the [employer] Zones  Delicious Desserts  Savory Soups  [employer] - Meals in a Astronomer Appetizers and Snacks  Comforting Weekend Breakfasts  One-Pot Wonders   Fast Evening Meals  Landscape architect Your Pritikin Plate  WORKSHOPS   Healthy Mindset (Psychosocial):  Focused Goals, Sustainable Changes Clinical staff led group instruction and group discussion with PowerPoint presentation and patient guidebook. To enhance the learning environment the use of posters, models and videos may be added. Patients will be able to apply effective goal setting strategies to establish at least one personal goal, and then take consistent, meaningful action toward that goal. They will learn to identify common barriers to achieving personal goals and develop strategies to overcome them. Patients will also gain an understanding of how our mind-set can impact our ability to achieve goals and the importance of cultivating a positive and growth-oriented mind-set. The purpose of this lesson is to provide patients with a deeper understanding of how to set and achieve personal goals, as well as the tools and strategies needed to overcome common obstacles which may arise along the way.  From Head to Heart: The Power of a Healthy Outlook  Clinical staff led group instruction and group discussion with PowerPoint presentation and patient guidebook. To enhance the learning environment the use of posters, models and videos may be added. Patients will be able to recognize  and  describe the impact of emotions and mood on physical health. They will discover the importance of self-care and explore self-care practices which may work for them. Patients will also learn how to utilize the 4 C's to cultivate a healthier outlook and better manage stress and challenges. The purpose of this lesson is to demonstrate to patients how a healthy outlook is an essential part of maintaining good health, especially as they continue their cardiac rehab journey.  Healthy Sleep for a Healthy Heart Clinical staff led group instruction and group discussion with PowerPoint presentation and patient guidebook. To enhance the learning environment the use of posters, models and videos may be added. At the conclusion of this workshop, patients will be able to demonstrate knowledge of the importance of sleep to overall health, well-being, and quality of life. They will understand the symptoms of, and treatments for, common sleep disorders. Patients will also be able to identify daytime and nighttime behaviors which impact sleep, and they will be able to apply these tools to help manage sleep-related challenges. The purpose of this lesson is to provide patients with a general overview of sleep and outline the importance of quality sleep. Patients will learn about a few of the most common sleep disorders. Patients will also be introduced to the concept of "sleep hygiene," and discover ways to self-manage certain sleeping problems through simple daily behavior changes. Finally, the workshop will motivate patients by clarifying the links between quality sleep and their goals of heart-healthy living.   Recognizing and Reducing Stress Clinical staff led group instruction and group discussion with PowerPoint presentation and patient guidebook. To enhance the learning environment the use of posters, models and videos may be added. At the conclusion of this workshop, patients will be able to understand the types of stress  reactions, differentiate between acute and chronic stress, and recognize the impact that chronic stress has on their health. They will also be able to apply different coping mechanisms, such as reframing negative self-talk. Patients will have the opportunity to practice a variety of stress management techniques, such as deep abdominal breathing, progressive muscle relaxation, and/or guided imagery.  The purpose of this lesson is to educate patients on the role of stress in their lives and to provide healthy techniques for coping with it.  Learning Barriers/Preferences:  Learning Barriers/Preferences - 01/04/23 1317       Learning Barriers/Preferences   Learning Barriers None    Learning Preferences Pictoral;Video;Written Material;Computer/Internet             Education Topics:  Knowledge Questionnaire Score:  Knowledge Questionnaire Score - 01/04/23 1322       Knowledge Questionnaire Score   Pre Score 18/24             Core Components/Risk Factors/Patient Goals at Admission:  Personal Goals and Risk Factors at Admission - 01/04/23 1324       Core Components/Risk Factors/Patient Goals on Admission    Weight Management Yes;Obesity;Weight Loss    Intervention Weight Management: Develop a combined nutrition and exercise program designed to reach desired caloric intake, while maintaining appropriate intake of nutrient and fiber, sodium and fats, and appropriate energy expenditure required for the weight goal.;Weight Management: Provide education and appropriate resources to help participant work on and attain dietary goals.;Weight Management/Obesity: Establish reasonable short term and long term weight goals.;Obesity: Provide education and appropriate resources to help participant work on and attain dietary goals.    Admit Weight 241 lb 6.5 oz (109.5 kg)  Expected Outcomes Short Term: Continue to assess and modify interventions until short term weight is achieved;Long Term:  Adherence to nutrition and physical activity/exercise program aimed toward attainment of established weight goal;Weight Loss: Understanding of general recommendations for a balanced deficit meal plan, which promotes 1-2 lb weight loss per week and includes a negative energy balance of [phone] kcal/d;Understanding recommendations for meals to include 15-35% energy as protein, 25-35% energy from fat, 35-60% energy from carbohydrates, less than 200mg  of dietary cholesterol, 20-35 gm of total fiber daily;Understanding of distribution of calorie intake throughout the day with the consumption of 4-5 meals/snacks    Hypertension Yes    Intervention Provide education on lifestyle modifcations including regular physical activity/exercise, weight management, moderate sodium restriction and increased consumption of fresh fruit, vegetables, and low fat dairy, alcohol moderation, and smoking cessation.;Monitor prescription use compliance.    Expected Outcomes Short Term: Continued assessment and intervention until BP is < 140/68mm HG in hypertensive participants. < 130/2mm HG in hypertensive participants with diabetes, heart failure or chronic kidney disease.;Long Term: Maintenance of blood pressure at goal levels.    Lipids Yes    Intervention Provide education and support for participant on nutrition & aerobic/resistive exercise along with prescribed medications to achieve LDL 70mg , HDL >40mg .    Expected Outcomes Short Term: Participant states understanding of desired cholesterol values and is compliant with medications prescribed. Participant is following exercise prescription and nutrition guidelines.;Long Term: Cholesterol controlled with medications as prescribed, with individualized exercise RX and with personalized nutrition plan. Value goals: LDL < 70mg , HDL > 40 mg.    Personal Goal Other Yes    Personal Goal Short: consult RD, Ex routine Long: Wt loss, stamina flexibility    Intervention Will continue to  monitor pt and progress workloads as tolerated without sign or symptom    Expected Outcomes Pt will achieve his goals             Core Components/Risk Factors/Patient Goals Review:   Goals and Risk Factor Review     Row Name 01/18/23 1125             Core Components/Risk Factors/Patient Goals Review   Personal Goals Review Weight Management/Obesity;Lipids;Hypertension       Review [name] started cardiac rehab on 01/08/23. [name] is off to a good start to exercise. vital signs have been stable.       Expected Outcomes [name] will continue to participate in cardiac rehab for exercise, nutrition and lifestyle modifications                Core Components/Risk Factors/Patient Goals at Discharge (Final Review):   Goals and Risk Factor Review - 01/18/23 1125       Core Components/Risk Factors/Patient Goals Review   Personal Goals Review Weight Management/Obesity;Lipids;Hypertension    Review [name] started cardiac rehab on 01/08/23. [name] is off to a good start to exercise. vital signs have been stable.    Expected Outcomes [name] will continue to participate in cardiac rehab for exercise, nutrition and lifestyle modifications             ITP Comments:  ITP Comments     Row Name 01/04/23 1058 01/18/23 1108         ITP Comments Dr. Armanda Magic medical director. Introduction to pritikin education/intensive cardiac rehab. Initial orientation packet reviewed with patient. 30 Day ITP Review. [name] started cardiac rehab on 01/08/23. [name] does well with participation when in attendance.  Comments: See ITP comments.Thayer Headings RN BSN

## 2023-01-19 ENCOUNTER — Encounter (HOSPITAL_COMMUNITY)
Admission: RE | Admit: 2023-01-19 | Discharge: 2023-01-19 | Disposition: A | Discharge status: Home / Self Care | Payer: 59 | Source: Ambulatory Visit | Attending: Cardiology | Admitting: Cardiology

## 2023-01-19 DIAGNOSIS — Z955 Presence of coronary angioplasty implant and graft: Secondary | ICD-10-CM

## 2023-01-19 DIAGNOSIS — Z48812 Encounter for surgical aftercare following surgery on the circulatory system: Secondary | ICD-10-CM | POA: Diagnosis not present

## 2023-01-22 ENCOUNTER — Encounter (HOSPITAL_COMMUNITY)
Admission: RE | Admit: 2023-01-22 | Discharge: 2023-01-22 | Disposition: A | Discharge status: Home / Self Care | Payer: 59 | Source: Ambulatory Visit | Attending: Cardiology | Admitting: Cardiology

## 2023-01-22 DIAGNOSIS — Z48812 Encounter for surgical aftercare following surgery on the circulatory system: Secondary | ICD-10-CM | POA: Diagnosis not present

## 2023-01-22 DIAGNOSIS — Z955 Presence of coronary angioplasty implant and graft: Secondary | ICD-10-CM

## 2023-01-24 ENCOUNTER — Encounter (HOSPITAL_COMMUNITY)
Admission: RE | Admit: 2023-01-24 | Discharge: 2023-01-24 | Disposition: A | Discharge status: Home / Self Care | Payer: 59 | Source: Ambulatory Visit | Attending: Cardiology | Admitting: Cardiology

## 2023-01-24 DIAGNOSIS — Z48812 Encounter for surgical aftercare following surgery on the circulatory system: Secondary | ICD-10-CM | POA: Diagnosis not present

## 2023-01-24 DIAGNOSIS — Z955 Presence of coronary angioplasty implant and graft: Secondary | ICD-10-CM

## 2023-01-24 NOTE — Progress Notes (Signed)
CARDIAC REHAB PHASE 2  Reviewed home exercise with pt today. Pt is tolerating exercise well. Pt will continue to exercise on his own by walking, using his E-Bike and stretching for 30-45 minutes per session 2-3 days a week in addition to the 3 days in CRP2. Advised pt on THRR, RPE scale, hydration and temperature/humidity precautions. Reinforced NTG use, S/S to stop exercise and when to call MD vs 911. Encouraged warm up cool down and stretches with exercise sessions. Pt verbalized understanding, all questions were answered and pt was given a copy to take home.    [name] ACSM-CEP 01/24/2023 2:21 PM

## 2023-01-26 ENCOUNTER — Encounter (HOSPITAL_COMMUNITY): Payer: 59

## 2023-01-29 ENCOUNTER — Encounter (HOSPITAL_COMMUNITY): Payer: 59

## 2023-01-29 ENCOUNTER — Encounter (HOSPITAL_COMMUNITY)
Admission: RE | Admit: 2023-01-29 | Discharge: 2023-01-29 | Disposition: A | Discharge status: Home / Self Care | Payer: 59 | Source: Ambulatory Visit | Attending: Cardiology | Admitting: Cardiology

## 2023-01-31 ENCOUNTER — Encounter (HOSPITAL_COMMUNITY)
Admission: RE | Admit: 2023-01-31 | Discharge: 2023-01-31 | Disposition: A | Discharge status: Home / Self Care | Payer: 59 | Source: Ambulatory Visit | Attending: Cardiology | Admitting: Cardiology

## 2023-01-31 DIAGNOSIS — Z955 Presence of coronary angioplasty implant and graft: Secondary | ICD-10-CM

## 2023-01-31 DIAGNOSIS — Z48812 Encounter for surgical aftercare following surgery on the circulatory system: Secondary | ICD-10-CM | POA: Diagnosis not present

## 2023-02-02 ENCOUNTER — Encounter (HOSPITAL_COMMUNITY): Payer: 59

## 2023-02-02 LAB — LIPID PANEL
Chol/HDL Ratio: 3.3 {ratio} (ref 0.0–5.0)
Cholesterol, Total: 104 mg/dL (ref 100–199)
HDL: 32 mg/dL — ABNORMAL LOW (ref >39)
LDL Chol Calc (NIH): 33 mg/dL (ref 0–99)
Triglycerides: 254 mg/dL — ABNORMAL HIGH (ref 0–149)
VLDL Cholesterol Cal: 39 mg/dL (ref 5–40)

## 2023-02-02 LAB — LIPOPROTEIN A (LPA): Lipoprotein (a): 8.6 nmol/L (ref <75.0)

## 2023-02-05 ENCOUNTER — Encounter (HOSPITAL_COMMUNITY): Payer: 59

## 2023-02-07 ENCOUNTER — Encounter (HOSPITAL_COMMUNITY): Payer: 59

## 2023-02-09 ENCOUNTER — Encounter (HOSPITAL_COMMUNITY): Payer: 59

## 2023-02-11 NOTE — Progress Notes (Unsigned)
Office Visit    Patient Name: Andrew Price Date of Encounter: 02/12/2023  Primary Care Provider:  Henrine Screws, MD Primary Cardiologist:  Rollene Rotunda, MD  Chief Complaint    Hyperlipidemia   Significant Past Medical History   HTN Controlled on lisinopril  CAD DES to LAD , on DAPT for 6 months  obesity BMI 34.29  OSA Mild        Allergies  Allergen Reactions   Flexeril [Cyclobenzaprine]     Hyperactive   Sildenafil     Other Reaction(s): nasal congestion    History of Present Illness    Andrew Price is a 57 y.o. male patient of Dr Antoine Poche, in the office today to discuss options for cholesterol management.  Most recent labs show LDL is well controlled on rosuvastatin 40 mg (33).  Unfortunately triglycerides have been elevated and he was intolerant to both gemfibrozil and fenofibrate.   He was prescribed icosapent ethyl at his visit with Bettina Gavia in September.  Trigs has improved to 254 from 326 a year ago.    Today he is in the office to discuss options for triglyceride management.   He started the Vascepa about 3-4 weeks prior to the most recent labs and other than experiencing a fishy aftertaste, has done well.   He was hospitalized back in May for acute pancreatitis that was assumed to be alcohol induced.  (He has just been on a vacation in the [place]).    Insurance Carrier: [employer] (consulting)  Triglyceride goal:  < 150   Current Medications:   rosuvastatin, icoaspent ethyl  Previously tried:  fenofibrate - jitteriness; gemfibrozil - not effective  Family Hx: no history of elevated triglycerides in the family;  father had AF, htn, died at 13 pulmonary fibrosis; mother 4 pancreatic cancer; 1 brother, healthy, 2 children, son already with elevated cholesterol at age 46    Social Hx: Tobacco: no Alcohol: beer or bourbon couple of times per week, limits to 2 per day   Diet:   travels for work, so eats out on trips;  tries to avoid fast  foods; doesn't snack much - apples 1-2 per day;  eats beef, fish and chicken, no pork;  vegetables canned and fresh.  Has cut back on carbohydrates this year and tries to eat whole grain pasta and wheat bread.  Only occasional potatoes.   Exercise: cardiac rehab  walks 2-3 miles 2-3 times per week; just did 31 miles on ebike last week  Adherence Assessment  Do you ever forget to take your medication? [] Yes [x] No  Do you ever skip doses due to side effects? [] Yes [x] No  Do you have trouble affording your medicines? [] Yes [x] No  Are you ever unable to pick up your medication due to transportation difficulties? [] Yes [x] No   Adherence strategy: none  Accessory Clinical Findings   Lab Results  Component Value Date   CHOL 104 02/01/2023   HDL 32 (L) 02/01/2023   LDLCALC 33 02/01/2023   TRIG 254 (H) 02/01/2023   CHOLHDL 3.3 02/01/2023    Lipoprotein (a)  Date/Time Value Ref Range Status  02/01/2023 08:15 AM 8.6 <75.0 nmol/L Final    Comment:    Note:  Values greater than or equal to 75.0 nmol/L may        indicate an independent risk factor for CHD,        but must be evaluated with caution when applied        to non-Caucasian  populations due to the        influence of genetic factors on Lp(a) across        ethnicities.     Lab Results  Component Value Date   ALT 31 08/08/2022   AST 26 08/08/2022   ALKPHOS 39 08/08/2022   BILITOT 4.0 (H) 08/08/2022   Lab Results  Component Value Date   CREATININE 0.95 12/08/2022   BUN 17 12/08/2022   NA 139 12/08/2022   K 4.4 12/08/2022   CL 100 12/08/2022   CO2 25 12/08/2022   Lab Results  Component Value Date   HGBA1C 6.1 (H) 12/20/2022    Home Medications    Current Outpatient Medications  Medication Sig Dispense Refill   aspirin EC (ASPIRIN EC ADULT LOW DOSE) 81 MG tablet Take 1 tablet (81 mg total) by mouth daily. Swallow whole. 90 tablet 3   clopidogrel (PLAVIX) 75 MG tablet Take 1 tablet (75 mg total) by mouth daily  with breakfast. 90 tablet 3   colchicine 0.6 MG tablet Take 0.6 mg by mouth daily as needed (gout).     icosapent Ethyl (VASCEPA) 1 g capsule Take 2 capsules (2 g total) by mouth 2 (two) times daily. 120 capsule 11   lisinopril (ZESTRIL) 10 MG tablet Take 1 tablet (10 mg total) by mouth daily. 90 tablet 3   loratadine (CLARITIN) 10 MG tablet Take 10 mg by mouth daily as needed for allergies.     Multiple Vitamins-Minerals (MULTIVITAMIN WITH MINERALS) tablet Take 1 tablet by mouth daily. Men 50+     neomycin-polymyxin-dexameth (MAXITROL) 0.1 % OINT Place 1 Application into both eyes once a week. Eye lid     nitroGLYCERIN (NITROSTAT) 0.4 MG SL tablet Place 1 tablet (0.4 mg total) under the tongue every 5 (five) minutes as needed for chest pain. 25 tablet 2   rosuvastatin (CRESTOR) 40 MG tablet Take 1 tablet (40 mg total) by mouth daily. 90 tablet 3   No current facility-administered medications for this visit.     Assessment & Plan    Hypertriglyceridemia Assessment: Patient with hypertriglyceridemia, not at goal of < 150. Most recent TG 254 on 02/01/23 Has been compliant with rosuvastatin 40, icosapent ethyl 2 gm bid Not able to tolerate fenofibrate or gemfibrozil Has history of pancreatitis 2/2 increased alcohol consumption back in May 2024  Plan: Currently on maximum medication Will cut back on alcohol to max of 1 drink about twice weekly Focus on increasing whole grains - review healthy eating habits for elevated triglycerides Repeat labs after:  3 months Lipid Liver function    Phillips Hay, PharmD CPP [facility] [address]  [place], [place] [address] [phone]  02/12/2023, 9:27 AM

## 2023-02-11 NOTE — Patient Instructions (Signed)
Your Results:             Your most recent labs Goal  Total Cholesterol 104 < 200  Triglycerides 254 < 150  HDL (happy/good cholesterol) 32 > 40  LDL (lousy/bad cholesterol 33 < 70   Medication:  Continue with your current medications.  You can put the Vascepa in the refrigerator to help decrease the fish oil taste.    Lab orders:  We want to repeat labs after 2-3 months.  We will send you a lab order to remind you once we get closer to that time.    Thank you for choosing [facility]    High Triglycerides Eating Plan Triglycerides are a type of fat in the blood. High levels of triglycerides can increase your risk of heart disease and stroke. If your triglyceride levels are high, choosing the right foods can help lower your triglycerides and keep your heart healthy. Work with your health care provider or a dietitian to develop an eating plan that is right for you. What are tips for following this plan? General guidelines  Lose weight, if you are overweight. For most people, losing 5-10 lb (2-5 kg) helps lower triglyceride levels. A weight-loss plan may include: 30 minutes of exercise at least 5 days a week. Reducing the amount of calories, sugar, and fat you eat. Eat a wide variety of fresh fruits, vegetables, and whole grains. These foods are high in fiber. Eat foods that contain healthy fats, such as fatty fish, nuts, seeds, and olive oil. Avoid foods that are high in added sugar, added salt (sodium), and saturated fat. Avoid low-fiber, refined carbohydrates such as white bread, crackers, noodles, and white rice. Avoid foods with trans fats or partially hydrogenated oils, such as fried foods or stick margarine. If you drink alcohol: Limit how much you have to: 0-1 drink a day for women who are not pregnant. 0-2 drinks a day for men. Your health care provider may recommend that you drink less than these amounts depending on your overall health. Know how much alcohol is in a  drink. In the [place] one drink equals one 12 oz bottle of beer (355 mL), one 5 oz glass of wine (148 mL), or one 1 oz glass of hard liquor (44 mL). Reading food labels Check food labels for: The amount of saturated fat. Choose foods with no or very little saturated fat (less than 2 g). The amount of trans fat. Choose foods with no transfat. The amount of cholesterol. Choose foods that are low in cholesterol. The amount of sodium. Choose foods with less than 140 milligrams (mg) per serving. Shopping Buy dairy products labeled as nonfat (skim) or low-fat (1%). Avoid buying processed or prepackaged foods. These are often high in added sugar, sodium, and fat. Cooking Choose healthy fats when cooking, such as olive oil, avocado oil, or canola oil. Cook foods using lower fat methods, such as baking, broiling, boiling, or grilling. Make your own sauces, dressings, and marinades when possible, instead of buying them. Store-bought sauces, dressings, and marinades are often high in sodium and sugar. Meal planning Eat more home-cooked food and less restaurant, buffet, and fast food. Eat fatty fish at least 2 times each week. Examples of fatty fish include salmon, trout, sardines, mackerel, tuna, and herring. If you eat whole eggs, do not eat more than 4 egg yolks per week. What foods should I eat? Fruits All fresh, canned (in natural juice), or frozen fruits. Vegetables Fresh or frozen vegetables.  Low-sodium canned vegetables. Grains Whole wheat or whole grain breads, crackers, cereals, and pasta. Unsweetened oatmeal. Bulgur. Barley. Quinoa. Brown rice. Whole wheat flour tortillas. Meats and other proteins Skinless chicken or [place]. Ground chicken or [place]. Lean cuts of pork, trimmed of fat. Fish and seafood, especially salmon, trout, and herring. Egg whites. Dried beans, peas, or lentils. Unsalted nuts or seeds. Unsalted canned beans. Natural peanut or almond butter or other nut  butters. Dairy Low-fat dairy products. Skim or low-fat (1%) milk. Reduced fat (2%) and low-sodium cheese. Low-fat ricotta cheese. Low-fat cottage cheese. Plain, low-fat yogurt. Fats and oils Tub margarine without trans fats. Light or reduced-fat mayonnaise. Light or reduced-fat salad dressings. Avocado. Safflower, olive, sunflower, soybean, and canola oils. The items listed above may not be a complete list of recommended foods and beverages. Talk with your dietitian about what dietary choices are best for you. What foods should I avoid? Fruits Sweetened dried fruit. Canned fruit in syrup. Fruit juice. Vegetables Creamed or fried vegetables. Vegetables in a cheese sauce. Grains White bread. White (regular) pasta. White rice. Cornbread. Bagels. Pastries. Crackers that contain trans fat. Meats and other proteins Fatty cuts of meat. Ribs. Chicken wings. Tomasa Blase. Sausage. Bologna. Salami. Chitterlings. Fatback. Hot dogs. Bratwurst. Packaged lunch meats. Dairy Whole or reduced-fat (2%) milk. Half-and-half. Cream cheese. Full-fat or sweetened yogurt. Full-fat cheese. Nondairy creamers. Whipped toppings. Processed cheese or cheese spreads. Cheese curds. Fats and oils Butter. Stick margarine. Lard. Shortening. Ghee. Bacon fat. Tropical oils, such as coconut, palm kernel, or palm oils. Beverages Alcohol. Sweetened drinks, such as soda, lemonade, fruit drinks, or punches. Sweets and desserts Corn syrup. Sugars. Honey. Molasses. Candy. Jam and jelly. Syrup. Sweetened cereals. Cookies. Pies. Cakes. Donuts. Muffins. Ice cream. Condiments Store-bought sauces, dressings, and marinades that are high in sugar, such as ketchup and barbecue sauce. The items listed above may not be a complete list of foods and beverages you should avoid. Talk with your dietitian about what dietary choices are best for you. Summary High levels of triglycerides can increase the risk of heart disease and stroke. Choosing the right  foods can help lower your triglycerides. Eat plenty of fresh fruits, vegetables, and whole grains. Choose low-fat dairy and lean meats. Eat fatty fish at least twice a week. Avoid processed and prepackaged foods with added sugar, sodium, saturated fat, and trans fat. If you need suggestions or have questions about what types of food are good for you, talk with your health care provider or a dietitian. This information is not intended to replace advice given to you by your health care provider. Make sure you discuss any questions you have with your health care provider. Document Revised: 07/30/2020 Document Reviewed: 07/30/2020 Elsevier Patient Education  2024 [employer].

## 2023-02-12 ENCOUNTER — Encounter: Payer: Self-pay | Admitting: Pharmacist Clinician (PhC)/ Clinical Pharmacy Specialist

## 2023-02-12 ENCOUNTER — Ambulatory Visit: Payer: 59 | Attending: Cardiology | Admitting: Pharmacist Clinician (PhC)/ Clinical Pharmacy Specialist

## 2023-02-12 ENCOUNTER — Encounter (HOSPITAL_COMMUNITY)
Admission: RE | Admit: 2023-02-12 | Discharge: 2023-02-12 | Disposition: A | Discharge status: Home / Self Care | Payer: 59 | Source: Ambulatory Visit | Attending: Cardiology | Admitting: Cardiology

## 2023-02-12 DIAGNOSIS — E781 Pure hyperglyceridemia: Secondary | ICD-10-CM | POA: Insufficient documentation

## 2023-02-12 DIAGNOSIS — R079 Chest pain, unspecified: Secondary | ICD-10-CM

## 2023-02-12 DIAGNOSIS — Z955 Presence of coronary angioplasty implant and graft: Secondary | ICD-10-CM | POA: Diagnosis present

## 2023-02-12 NOTE — Assessment & Plan Note (Signed)
Assessment: Patient with hypertriglyceridemia, not at goal of < 150. Most recent TG 254 on 02/01/23 Has been compliant with rosuvastatin 40, icosapent ethyl 2 gm bid Not able to tolerate fenofibrate or gemfibrozil Has history of pancreatitis 2/2 increased alcohol consumption back in May 2024  Plan: Currently on maximum medication Will cut back on alcohol to max of 1 drink about twice weekly Focus on increasing whole grains - review healthy eating habits for elevated triglycerides Repeat labs after:  3 months Lipid Liver function

## 2023-02-14 ENCOUNTER — Encounter (HOSPITAL_COMMUNITY)
Admission: RE | Admit: 2023-02-14 | Discharge: 2023-02-14 | Disposition: A | Discharge status: Home / Self Care | Payer: 59 | Source: Ambulatory Visit | Attending: Cardiology | Admitting: Cardiology

## 2023-02-14 DIAGNOSIS — Z955 Presence of coronary angioplasty implant and graft: Secondary | ICD-10-CM | POA: Diagnosis not present

## 2023-02-16 ENCOUNTER — Encounter (HOSPITAL_COMMUNITY)
Admission: RE | Admit: 2023-02-16 | Discharge: 2023-02-16 | Disposition: A | Discharge status: Home / Self Care | Payer: 59 | Source: Ambulatory Visit | Attending: Cardiology | Admitting: Cardiology

## 2023-02-16 DIAGNOSIS — Z955 Presence of coronary angioplasty implant and graft: Secondary | ICD-10-CM

## 2023-02-16 NOTE — Progress Notes (Signed)
Andrew Price 57 y.o. male  Andrew Price is motivated to make lifestyle changes to aid with cardiac rehab. Patient has medical history of HTN, sleep apnea, acute pancreatitis, hypertriglyceridemia, hx of stent placement. He continues Vascepa for elevated triglycerides. A1c remains in a pre-diabetic range. He remains motivated to lose weight but admits travel for work is a barrier to appopriate/ consistent heart healthy food choices.  His LDL remains well controlled on rosuvastatin (33).  He has previously used the Next 56 day diet and low carbohydrate diet as a strategy for weight loss. His wife is very supportive of lifestyle changes.    RMR: (1.2-1.5) 2329-2911kcals/day  Labs: Triglycerides 254, HDL 32, LDL 33, LipoproteinA WNL, Y8M 6.1  24 hour diet recall:  Breakfast:oikos greek yogurt, blueberries OR spanish omlette with wheat toast Lunch: [employer]  Dinner: protein, vegetables  Snacks: apple, nuts, cheese + gluten free crackers  Beverages: water, unsweetened tea, black coffee, occasional alcoholic drink- lite beer or bourbon  Nutrition Diagnosis Obesity related to excessive energy intake as evidenced by a BMI of 35.0  Nutrition Intervention Pt's individual nutrition plan reviewed with pt. Benefits of adopting Heart Healthy diet discussed.  Continue client-centered nutrition education by RD, as part of interdisciplinary care.  Monitor/Evaluation: Patient reports motivation to make lifestyle changes for adherence to heart healthy diet recommendation, blood sugar control, and weight management. We discussed strategies for weight loss including the plate method/portion sizes, calorie density, etc. We further discussed saturated fat intake, sodium intake, snacks, and protein supplements. Handouts/notes given. Patient amicable to RD suggestions and verbalizes understanding. Will follow-up as needed.   40 minutes spent in review of topics related to a heart healthy diet including sodium  intake, blood sugar control, weight management, label reading, hydration, and fiber intake. Start Time: 9:45 End Time: 10:25  Goal(s) 1.Patient to limit saturated fat and trans fat and prioritize lean protein fish, poultry, nonfat dairy, and vegetarian protein sources. Aim for <4-8oz per meal.  2.   Patient to increase fiber intake to 25-35g/day through whole grains, fruits, and vegetables. Patient to limit refined carbohydrates, simple sugars- look for products with <7g added sugar.  3.  Patient to prioritize water and other zero calorie alternatives; limit/eliminate any sugar beverages.  4. Patient to identify high sodium food choices and reduce sodium intake to <2300 mg, aiming for 1500-2000 mg daily. 5. Patient to practice mindful and intuitive eating exercise including eating on a schedule, understanding hunger/fullness, etc. 6. Patient to identify strategies and food quantities to achieve weight/weight gain of 0.5-2.0# per week. Consider the plate method as a guide for meal planning, portion sizes, calorie density. Could consider protein drink- look for products with <220 calories, 20-30g protein, <7g added sugar.   Plan:  Will provide client-centered nutrition education as part of interdisciplinary care Monitor and evaluate progress toward nutrition goal with team. Patient given the option to attend Pritikin education, cooking workshops, and educational videos   Hiromi Knodel [place], MS, RDN, LDN

## 2023-02-19 ENCOUNTER — Encounter (HOSPITAL_COMMUNITY): Payer: 59

## 2023-02-19 NOTE — Progress Notes (Signed)
Cardiac Individual Treatment Plan  Patient Details  Name: Andrew Price MRN: [id number] Date of Birth: [birth year] Referring Provider:   Flowsheet Row INTENSIVE CARDIAC REHAB ORIENT from 01/04/2023 in [facility]  Referring Provider Dr. Rollene Rotunda MD       Initial Encounter Date:  Flowsheet Row INTENSIVE CARDIAC REHAB ORIENT from 01/04/2023 in [facility] for Heart, Vascular, & Lung Health  Date 01/04/23       Visit Diagnosis: 12/21/22 DES LAD  Patient's Home Medications on Admission:  Current Outpatient Medications:    aspirin EC (ASPIRIN EC ADULT LOW DOSE) 81 MG tablet, Take 1 tablet (81 mg total) by mouth daily. Swallow whole., Disp: 90 tablet, Rfl: 3   clopidogrel (PLAVIX) 75 MG tablet, Take 1 tablet (75 mg total) by mouth daily with breakfast., Disp: 90 tablet, Rfl: 3   colchicine 0.6 MG tablet, Take 0.6 mg by mouth daily as needed (gout)., Disp: , Rfl:    icosapent Ethyl (VASCEPA) 1 g capsule, Take 2 capsules (2 g total) by mouth 2 (two) times daily., Disp: 120 capsule, Rfl: 11   lisinopril (ZESTRIL) 10 MG tablet, Take 1 tablet (10 mg total) by mouth daily., Disp: 90 tablet, Rfl: 3   loratadine (CLARITIN) 10 MG tablet, Take 10 mg by mouth daily as needed for allergies., Disp: , Rfl:    Multiple Vitamins-Minerals (MULTIVITAMIN WITH MINERALS) tablet, Take 1 tablet by mouth daily. Men 50+, Disp: , Rfl:    neomycin-polymyxin-dexameth (MAXITROL) 0.1 % OINT, Place 1 Application into both eyes once a week. Eye lid, Disp: , Rfl:    nitroGLYCERIN (NITROSTAT) 0.4 MG SL tablet, Place 1 tablet (0.4 mg total) under the tongue every 5 (five) minutes as needed for chest pain., Disp: 25 tablet, Rfl: 2   rosuvastatin (CRESTOR) 40 MG tablet, Take 1 tablet (40 mg total) by mouth daily., Disp: 90 tablet, Rfl: 3  Past Medical History: Past Medical History:  Diagnosis Date   Sullivan Lone syndrome    Gout     Hypertension    Mild sleep apnea    Obesity (BMI 30-39.9)     Tobacco Use: Social History   Tobacco Use  Smoking Status Never  Smokeless Tobacco Not on file    Labs: Review Flowsheet       Latest Ref Rng & Units 12/20/2022 02/01/2023  Labs for ITP Cardiac and Pulmonary Rehab  Cholestrol 100 - 199 mg/dL - 528   LDL (calc) 0 - 99 mg/dL - 33   HDL-C >41 mg/dL - 32   Trlycerides 0 - 149 mg/dL - 324   Hemoglobin M0N 4.8 - 5.6 % 6.1  -    Details            Capillary Blood Glucose: Lab Results  Component Value Date   GLUCAP 211 (H) 08/07/2022   GLUCAP 236 (H) 08/06/2022   GLUCAP 225 (H) 08/06/2022   GLUCAP 193 (H) 08/05/2022     Exercise Target Goals: Exercise Program Goal: Individual exercise prescription set using results from initial 6 min walk test and THRR while considering  patient's activity barriers and safety.   Exercise Prescription Goal: Initial exercise prescription builds to 30-45 minutes a day of aerobic activity, 2-3 days per week.  Home exercise guidelines will be given to patient during program as part of exercise prescription that the participant will acknowledge.  Activity Barriers & Risk Stratification:  Activity Barriers & Cardiac Risk Stratification -  01/04/23 1307       Activity Barriers & Cardiac Risk Stratification   Activity Barriers Back Problems;Deconditioning    Cardiac Risk Stratification High   under 5 MET's            6 Minute Walk:  6 Minute Walk     Row Name 01/04/23 1250         6 Minute Walk   Phase Initial     Distance 1610 feet     Walk Time 6 minutes     # of Rest Breaks 0     MPH 3.04     METS 4.03     RPE 8     Perceived Dyspnea  0     VO2 Peak 14.12     Symptoms No     Resting HR 66 bpm     Resting BP 148/86     Resting Oxygen Saturation  99 %     Exercise Oxygen Saturation  during 6 min walk 100 %     Max Ex. HR 113 bpm     Max Ex. BP 150/86     2 Minute Post BP 144/84               Oxygen Initial Assessment:   Oxygen Re-Evaluation:   Oxygen Discharge (Final Oxygen Re-Evaluation):   Initial Exercise Prescription:  Initial Exercise Prescription - 01/04/23 1300       Date of Initial Exercise RX and Referring Provider   Date 01/04/23    Referring Provider Dr. Rollene Rotunda MD    Expected Discharge Date 03/26/23      Recumbant Bike   Level 2    RPM 60    Watts 25    Minutes 15    METs 2      Arm Ergometer   Level 2    Watts 15    RPM 45    Minutes 15    METs 1.9      Prescription Details   Frequency (times per week) 3    Duration Progress to 30 minutes of continuous aerobic without signs/symptoms of physical distress      Intensity   THRR 40-80% of Max Heartrate 65-131    Ratings of Perceived Exertion 11-13    Perceived Dyspnea 0-4      Progression   Progression Continue progressive overload as per policy without signs/symptoms or physical distress.      Resistance Training   Training Prescription Yes    Weight 4    Reps 10-15             Perform Capillary Blood Glucose checks as needed.  Exercise Prescription Changes:   Exercise Prescription Changes     Row Name 01/08/23 0845 01/24/23 1415 02/12/23 0843         Response to Exercise   Blood Pressure (Admit) 128/82 124/78 134/78     Blood Pressure (Exercise) 140/68 -- 142/72     Blood Pressure (Exit) 122/70 128/72 136/78     Heart Rate (Admit) 72 bpm 67 bpm 70 bpm     Heart Rate (Exercise) 97 bpm 131 bpm 141 bpm     Heart Rate (Exit) 77 bpm 74 bpm 84 bpm     Rating of Perceived Exertion (Exercise) 9.5 10 11      Perceived Dyspnea (Exercise) 0 0 0     Symptoms 0 0 0     Comments Pt first day in the [employer] program Reviewed MET's,  goals and home ExRx Reviewed MET's     Duration Progress to 30 minutes of  aerobic without signs/symptoms of physical distress Progress to 30 minutes of  aerobic without signs/symptoms of physical distress Progress to 30 minutes of   aerobic without signs/symptoms of physical distress     Intensity THRR unchanged THRR unchanged THRR unchanged       Progression   Progression Continue to progress workloads to maintain intensity without signs/symptoms of physical distress. Continue to progress workloads to maintain intensity without signs/symptoms of physical distress. Continue to progress workloads to maintain intensity without signs/symptoms of physical distress.     Average METs 1.9 3.8 4.86       Resistance Training   Training Prescription Yes No No     Weight 4 4 5      Reps 10-15 10-15 10-15     Time 10 Minutes 10 Minutes 10 Minutes       Recumbant Bike   Level 2 -- 3     RPM 80 -- 78     Watts 19 -- 47     Minutes 15 -- 15     METs 2.2 -- 3.3       Arm Ergometer   Level 2 2 --     Watts -- 30 --     RPM -- 58 --     Minutes 15 15 --     METs 1.6 2.3 --       Rower   Level -- 1 3     Watts -- 57 --     Minutes -- 15 15     METs -- 5.3 6.42       Home Exercise Plan   Plans to continue exercise at -- Home (comment) Home (comment)     Frequency -- Add 3 additional days to program exercise sessions. Add 3 additional days to program exercise sessions.     Initial Home Exercises Provided -- 01/24/23 01/24/23              Exercise Comments:   Exercise Comments     Row Name 01/08/23 0851 01/24/23 1420 02/12/23 0846       Exercise Comments Pt first day in the [facility] program. Pt tolerated exercise well with an average MET level of 1.9. Pt is learning his THRR, RPE and ExRx. Off to a great start. Reviewed MET's, goals and home ExRx. Pt tolerated exercise well with an average MET level of 3.8. Pt is feeling good about his goals and is gaining strength and stamina. Gave pt a packet on stretches to work on flexibility. He also reached out to the RD today for a one on one consult. He is already exercising on his own by walking, E-Bike and stretching 2-3 days for 30-45 mins per session Reviewed MET's,  goals and home ExRx. Pt tolerated exercise well with an average MET level of 3.8. Pt is doing well and progressing MET's. He is beginning to reach the upper limits of his THRR, will continue to monitor to see if a THRR INC may be needed in the future.              Exercise Goals and Review:   Exercise Goals     Row Name 01/04/23 1313             Exercise Goals   Increase Physical Activity Yes       Intervention Provide advice, education, support and counseling about physical activity/exercise  needs.;Develop an individualized exercise prescription for aerobic and resistive training based on initial evaluation findings, risk stratification, comorbidities and participant's personal goals.       Expected Outcomes Short Term: Attend rehab on a regular basis to increase amount of physical activity.;Long Term: Exercising regularly at least 3-5 days a week.;Long Term: Add in home exercise to make exercise part of routine and to increase amount of physical activity.       Increase Strength and Stamina Yes       Intervention Provide advice, education, support and counseling about physical activity/exercise needs.;Develop an individualized exercise prescription for aerobic and resistive training based on initial evaluation findings, risk stratification, comorbidities and participant's personal goals.       Expected Outcomes Short Term: Increase workloads from initial exercise prescription for resistance, speed, and METs.;Short Term: Perform resistance training exercises routinely during rehab and add in resistance training at home;Long Term: Improve cardiorespiratory fitness, muscular endurance and strength as measured by increased METs and functional capacity ( )       Able to understand and use rate of perceived exertion (RPE) scale Yes       Intervention Provide education and explanation on how to use RPE scale       Expected Outcomes Short Term: Able to use RPE daily in rehab to express  subjective intensity level;Long Term:  Able to use RPE to guide intensity level when exercising independently       Knowledge and understanding of Target Heart Rate Range (THRR) Yes       Intervention Provide education and explanation of THRR including how the numbers were predicted and where they are located for reference       Expected Outcomes Short Term: Able to state/look up THRR;Long Term: Able to use THRR to govern intensity when exercising independently;Short Term: Able to use daily as guideline for intensity in rehab       Understanding of Exercise Prescription Yes       Intervention Provide education, explanation, and written materials on patient's individual exercise prescription       Expected Outcomes Short Term: Able to explain program exercise prescription;Long Term: Able to explain home exercise prescription to exercise independently                Exercise Goals Re-Evaluation :  Exercise Goals Re-Evaluation     Row Name 01/08/23 0850 01/24/23 0830           Exercise Goal Re-Evaluation   Exercise Goals Review Increase Physical Activity;Understanding of Exercise Prescription;Increase Strength and Stamina;Knowledge and understanding of Target Heart Rate Range (THRR);Able to understand and use rate of perceived exertion (RPE) scale Increase Physical Activity;Understanding of Exercise Prescription;Increase Strength and Stamina;Knowledge and understanding of Target Heart Rate Range (THRR);Able to understand and use rate of perceived exertion (RPE) scale      Comments Pt first day in the [facility] program. Pt tolerated exercise well with an average MET level of 1.9. Pt is learning his THRR, RPE and ExRx. Off to a great start. Reviewed MET's, goals and home ExRx. Pt tolerated exercise well with an average MET level of 3.8. Pt is feeling good about his goals and is gaining strength and stamina. Gave pt a packet on stretches to work on flexibility. He also reached out to the RD  today for a one on one consult. He is already exercising on his own by walking, E-Bike and stretching 2-3 days for 30-45 mins per session  Expected Outcomes Will continue to monitor pt and progress workloads as tolerated without sign or symptom Will continue to monitor pt and progress workloads as tolerated without sign or symptom               Discharge Exercise Prescription (Final Exercise Prescription Changes):  Exercise Prescription Changes - 02/12/23 0843       Response to Exercise   Blood Pressure (Admit) 134/78    Blood Pressure (Exercise) 142/72    Blood Pressure (Exit) 136/78    Heart Rate (Admit) 70 bpm    Heart Rate (Exercise) 141 bpm    Heart Rate (Exit) 84 bpm    Rating of Perceived Exertion (Exercise) 11    Perceived Dyspnea (Exercise) 0    Symptoms 0    Comments Reviewed MET's    Duration Progress to 30 minutes of  aerobic without signs/symptoms of physical distress    Intensity THRR unchanged      Progression   Progression Continue to progress workloads to maintain intensity without signs/symptoms of physical distress.    Average METs 4.86      Resistance Training   Training Prescription No    Weight 5    Reps 10-15    Time 10 Minutes      Recumbant Bike   Level 3    RPM 78    Watts 47    Minutes 15    METs 3.3      Rower   Level 3    Minutes 15    METs 6.42      Home Exercise Plan   Plans to continue exercise at Home (comment)    Frequency Add 3 additional days to program exercise sessions.    Initial Home Exercises Provided 01/24/23             Nutrition:  Target Goals: Understanding of nutrition guidelines, daily intake of sodium 1500mg , cholesterol 200mg , calories 30% from fat and 7% or less from saturated fats, daily to have 5 or more servings of fruits and vegetables.  Biometrics:  Pre Biometrics - 01/04/23 1314       Pre Biometrics   Height 5\' 10"  (1.778 m)    Weight 109.5 kg    Waist Circumference 46 inches    Hip  Circumference 40.25 inches    Waist to Hip Ratio 1.14 %    BMI (Calculated) 34.64    Triceps Skinfold 12 mm    % Body Fat 31.4 %    Grip Strength 42 kg    Flexibility 11.75 in    Single Leg Stand 30 seconds              Nutrition Therapy Plan and Nutrition Goals:  Nutrition Therapy & Goals - 02/09/23 0919       Nutrition Therapy   Diet Heart healthy diet    Drug/Food Interactions Statins/Certain Fruits      Personal Nutrition Goals   Nutrition Goal Patient to identify strategies for reducing cardiovascular risk by attending the [facility] education and nutrition series weekly.   goal in progress.   Personal Goal #2 Patient to improve diet quality by using the plate method as a guide for meal planning to include lean protein/plant protein, fruits, vegetables, whole grains, nonfat dairy as part of a well-balanced diet.   goal in progress.   Personal Goal #3 Patient to reduce sodium intake to 1500-2000mg  per day.   goal in progress.   Comments Goals in progress. However, patient's attendance  to intensive cardiac rehab remains variable due to travel for work. He has attended 7 exercise sessions and 3 education sessions. Andrew Price has medical history of stent placement, hyperlipidemia, HTN,obesity. He has been started on Vascepa for elevated triglycerides. A1c remains in a pre-diabetic range. He remains motivated to lose weight but admits travel for work is a barrier to [employer]. He is up 1.1# since starting with our program. Patient will benefit from participation in intensive cardiac rehab for nutrition, exercise, and lifestyle modification.      Intervention Plan   Intervention Nutrition handout(s) given to patient.;Prescribe, educate and counsel regarding individualized specific dietary modifications aiming towards targeted core components such as weight, hypertension, lipid management, diabetes, heart failure and other comorbidities.    Expected Outcomes Short Term Goal:  Understand basic principles of dietary content, such as calories, fat, sodium, cholesterol and nutrients.;Long Term Goal: Adherence to prescribed nutrition plan.             Nutrition Assessments:  Nutrition Assessments - 01/05/23 1418       Rate Your Plate Scores   Pre Score 73            MEDIFICTS Score Key: >=70 Need to make dietary changes  40-70 Heart Healthy Diet <= 40 Therapeutic Level Cholesterol Diet   Flowsheet Row INTENSIVE CARDIAC REHAB ORIENT from 01/04/2023 in [facility] for Heart, Vascular, & Lung Health  Picture Your Plate Total Score on Admission 73      Picture Your Plate Scores: <87 Unhealthy dietary pattern with much room for improvement. 41-50 Dietary pattern unlikely to meet recommendations for good health and room for improvement. 51-60 More healthful dietary pattern, with some room for improvement.  >60 Healthy dietary pattern, although there may be some specific behaviors that could be improved.    Nutrition Goals Re-Evaluation:  Nutrition Goals Re-Evaluation     Row Name 01/08/23 1016 02/09/23 0919           Goals   Current Weight 241 lb 6.5 oz (109.5 kg) 242 lb 8.1 oz (110 kg)  weight from last attended session on 01/31/23      Comment A1c 6.1, cholestrol 191, HDL 31, LDL 61, triglycerides 663 no new labs; most recent labs A1c 6.1, cholestrol 191, HDL 31, LDL 61, triglycerides 663      Expected Outcome Andrew Price has medical history of stent placement, hyperlipidemia, HTN,obesity. He has been started on Vascepa for elevated triglycerides. A1c remains in a pre-diabetic range. Patient will benefit from participation in intensive cardiac rehab for nutrition, exercise, and lifestyle modification. Goals in progress. However, patient's attendance to intensive cardiac rehab remains variable due to travel for work. He has attended 7 exercise sessions and 3 education sessions. Andrew Price has medical history of stent placement,  hyperlipidemia, HTN,obesity. He has been started on Vascepa for elevated triglycerides. A1c remains in a pre-diabetic range. He remains motivated to lose weight but admits travel for work is a barrier to [employer]. He is up 1.1# since starting with our program. Patient will benefit from participation in intensive cardiac rehab for nutrition, exercise, and lifestyle modification.               Nutrition Goals Re-Evaluation:  Nutrition Goals Re-Evaluation     Row Name 01/08/23 1016 02/09/23 0919           Goals   Current Weight 241 lb 6.5 oz (109.5 kg) 242 lb 8.1 oz (110 kg)  weight from  last attended session on 01/31/23      Comment A1c 6.1, cholestrol 191, HDL 31, LDL 61, triglycerides 663 no new labs; most recent labs A1c 6.1, cholestrol 191, HDL 31, LDL 61, triglycerides 663      Expected Outcome Andrew Price has medical history of stent placement, hyperlipidemia, HTN,obesity. He has been started on Vascepa for elevated triglycerides. A1c remains in a pre-diabetic range. Patient will benefit from participation in intensive cardiac rehab for nutrition, exercise, and lifestyle modification. Goals in progress. However, patient's attendance to intensive cardiac rehab remains variable due to travel for work. He has attended 7 exercise sessions and 3 education sessions. Andrew Price has medical history of stent placement, hyperlipidemia, HTN,obesity. He has been started on Vascepa for elevated triglycerides. A1c remains in a pre-diabetic range. He remains motivated to lose weight but admits travel for work is a barrier to [employer]. He is up 1.1# since starting with our program. Patient will benefit from participation in intensive cardiac rehab for nutrition, exercise, and lifestyle modification.               Nutrition Goals Discharge (Final Nutrition Goals Re-Evaluation):  Nutrition Goals Re-Evaluation - 02/09/23 0919       Goals   Current Weight 242 lb 8.1 oz (110 kg)    weight from last attended session on 01/31/23   Comment no new labs; most recent labs A1c 6.1, cholestrol 191, HDL 31, LDL 61, triglycerides 663    Expected Outcome Goals in progress. However, patient's attendance to intensive cardiac rehab remains variable due to travel for work. He has attended 7 exercise sessions and 3 education sessions. Andrew Price has medical history of stent placement, hyperlipidemia, HTN,obesity. He has been started on Vascepa for elevated triglycerides. A1c remains in a pre-diabetic range. He remains motivated to lose weight but admits travel for work is a barrier to [employer]. He is up 1.1# since starting with our program. Patient will benefit from participation in intensive cardiac rehab for nutrition, exercise, and lifestyle modification.             Psychosocial: Target Goals: Acknowledge presence or absence of significant depression and/or stress, maximize coping skills, provide positive support system. Participant is able to verbalize types and ability to use techniques and skills needed for reducing stress and depression.  Initial Review & Psychosocial Screening:  Initial Psych Review & Screening - 01/04/23 1329       Initial Review   Current issues with Current Sleep Concerns   Some muscle aches, and chages in sleeping pattrers, but not due to stress or anxiety     Family Dynamics   Good Support System? Yes   Wife and daughter are a good support system     Barriers   Psychosocial barriers to participate in program There are no identifiable barriers or psychosocial needs.;The patient should benefit from training in stress management and relaxation.      Screening Interventions   Interventions Encouraged to exercise;Provide feedback about the scores to participant    Expected Outcomes Long Term Goal: Stressors or current issues are controlled or eliminated.;Short Term goal: Identification and review with participant of any Quality of Life or  Depression concerns found by scoring the questionnaire.;Long Term goal: The participant improves quality of Life and PHQ9 Scores as seen by post scores and/or verbalization of changes             Quality of Life Scores:  Quality of Life - 01/04/23 1316  Quality of Life   Select Quality of Life      Quality of Life Scores   Health/Function Pre 27.1 %    Socioeconomic Pre 27.36 %    Psych/Spiritual Pre 29.14 %    Family Pre 28.8 %    GLOBAL Pre 27.82 %            Scores of 19 and below usually indicate a poorer quality of life in these areas.  A difference of  2-3 points is a clinically meaningful difference.  A difference of 2-3 points in the total score of the Quality of Life Index has been associated with significant improvement in overall quality of life, self-image, physical symptoms, and general health in studies assessing change in quality of life.  PHQ-9: Review Flowsheet       01/04/2023  Depression screen PHQ 2/9  Decreased Interest 0  Down, Depressed, Hopeless 0  PHQ - 2 Score 0  Altered sleeping 1  Tired, decreased energy 0  Change in appetite 0  Feeling bad or failure about yourself  0  Trouble concentrating 0  Moving slowly or fidgety/restless 0  Suicidal thoughts 0  PHQ-9 Score 1    Details           Interpretation of Total Score  Total Score Depression Severity:  1-4 = Minimal depression, 5-9 = Mild depression, 10-14 = Moderate depression, 15-19 = Moderately severe depression, 20-27 = Severe depression   Psychosocial Evaluation and Intervention:   Psychosocial Re-Evaluation:  Psychosocial Re-Evaluation     Row Name 01/18/23 1124 02/19/23 1806           Psychosocial Re-Evaluation   Current issues with None Identified None Identified      Interventions Encouraged to attend Cardiac Rehabilitation for the exercise Encouraged to attend Cardiac Rehabilitation for the exercise      Continue Psychosocial Services  No Follow up required  No Follow up required               Psychosocial Discharge (Final Psychosocial Re-Evaluation):  Psychosocial Re-Evaluation - 02/19/23 1806       Psychosocial Re-Evaluation   Current issues with None Identified    Interventions Encouraged to attend Cardiac Rehabilitation for the exercise    Continue Psychosocial Services  No Follow up required             Vocational Rehabilitation: Provide vocational rehab assistance to qualifying candidates.   Vocational Rehab Evaluation & Intervention:  Vocational Rehab - 01/04/23 1323       Initial Vocational Rehab Evaluation & Intervention   Assessment shows need for Vocational Rehabilitation No   No needs, he is back at work            Education: Education Goals: Education classes will be provided on a weekly basis, covering required topics. Participant will state understanding/return demonstration of topics presented.    Education     Row Name 01/10/23 1100     Education   Cardiac Education Topics [facility]   Customer service manager   Weekly Topic Comforting Weekend Breakfasts   Instruction Review Code 1- Verbalizes Understanding   Class Start Time [phone]   Class Stop Time 0900   Class Time Calculation (min) 45 min    Row Name 01/19/23 0900     Education   Cardiac Education Topics Pritikin   Customer service manager  Weekly Topic Fast Evening Meals   Instruction Review Code 1- Verbalizes Understanding   Class Start Time 0815   Class Stop Time 0850   Class Time Calculation (min) 35 min    Row Name 01/24/23 0900     Education   Cardiac Education Topics Pritikin   Orthoptist   Educator Dietitian   Weekly Topic International Cuisine- Spotlight on the [facility] Zones   Instruction Review Code 1- Verbalizes Understanding   Class Start Time 0815   Class Stop Time [phone]   Class Time Calculation (min) 37 min     Row Name 02/14/23 0900     Education   Cardiac Education Topics Pritikin   Secondary school teacher School   Educator Dietitian   Weekly Topic Adding Flavor - Sodium-Free   Instruction Review Code 1- Verbalizes Understanding   Class Start Time 0815   Class Stop Time 0850   Class Time Calculation (min) 35 min    Row Name 02/16/23 0800     Education   Cardiac Education Topics Pritikin   Select Core Videos     Core Videos   Educator Exercise Physiologist   Select General Education   General Education Heart Disease Risk Reduction   Instruction Review Code 1- Verbalizes Understanding   Class Start Time [phone]   Class Stop Time 0858   Class Time Calculation (min) 42 min            Core Videos: Exercise    Move It!  Clinical staff conducted group or individual video education with verbal and written material and guidebook.  Patient learns the recommended Pritikin exercise program. Exercise with the goal of living a long, healthy life. Some of the health benefits of exercise include controlled diabetes, healthier blood pressure levels, improved cholesterol levels, improved heart and lung capacity, improved sleep, and better body composition. Everyone should speak with their doctor before starting or changing an exercise routine.  Biomechanical Limitations Clinical staff conducted group or individual video education with verbal and written material and guidebook.  Patient learns how biomechanical limitations can impact exercise and how we can mitigate and possibly overcome limitations to have an impactful and balanced exercise routine.  Body Composition Clinical staff conducted group or individual video education with verbal and written material and guidebook.  Patient learns that body composition (ratio of muscle mass to fat mass) is a key component to assessing overall fitness, rather than body weight alone. Increased fat mass, especially visceral belly fat, can  put [place] at increased risk for metabolic syndrome, type 2 diabetes, heart disease, and even death. It is recommended to combine diet and exercise (cardiovascular and resistance training) to improve your body composition. Seek guidance from your physician and exercise physiologist before implementing an exercise routine.  Exercise Action Plan Clinical staff conducted group or individual video education with verbal and written material and guidebook.  Patient learns the recommended strategies to achieve and enjoy long-term exercise adherence, including variety, self-motivation, self-efficacy, and positive decision making. Benefits of exercise include fitness, good health, weight management, more energy, better sleep, less stress, and overall well-being.  Medical   Heart Disease Risk Reduction Clinical staff conducted group or individual video education with verbal and written material and guidebook.  Patient learns our heart is our most vital organ as it circulates oxygen, nutrients, white blood cells, and hormones throughout the entire body, and carries waste away. Data supports a plant-based  eating plan like the Pritikin Program for its effectiveness in slowing progression of and reversing heart disease. The video provides a number of recommendations to address heart disease.   Metabolic Syndrome and Belly Fat  Clinical staff conducted group or individual video education with verbal and written material and guidebook.  Patient learns what metabolic syndrome is, how it leads to heart disease, and how one can reverse it and keep it from coming back. You have metabolic syndrome if you have 3 of the following 5 criteria: abdominal obesity, high blood pressure, high triglycerides, low HDL cholesterol, and high blood sugar.  Hypertension and Heart Disease Clinical staff conducted group or individual video education with verbal and written material and guidebook.  Patient learns that high blood pressure, or  hypertension, is very common in the [place]. Hypertension is largely due to excessive salt intake, but other important risk factors include being overweight, physical inactivity, drinking too much alcohol, smoking, and not eating enough potassium from fruits and vegetables. High blood pressure is a leading risk factor for heart attack, stroke, congestive heart failure, dementia, kidney failure, and premature death. Long-term effects of excessive salt intake include stiffening of the arteries and thickening of heart muscle and organ damage. Recommendations include ways to reduce hypertension and the risk of heart disease.  Diseases of Our Time - Focusing on Diabetes Clinical staff conducted group or individual video education with verbal and written material and guidebook.  Patient learns why the best way to stop diseases of our time is prevention, through food and other lifestyle changes. Medicine (such as prescription pills and surgeries) is often only a Band-Aid on the problem, not a long-term solution. Most common diseases of our time include obesity, type 2 diabetes, hypertension, heart disease, and cancer. The Pritikin Program is recommended and has been proven to help reduce, reverse, and/or prevent the damaging effects of metabolic syndrome.  Nutrition   Overview of the Pritikin Eating Plan  Clinical staff conducted group or individual video education with verbal and written material and guidebook.  Patient learns about the Pritikin Eating Plan for disease risk reduction. The Pritikin Eating Plan emphasizes a wide variety of unrefined, minimally-processed carbohydrates, like fruits, vegetables, whole grains, and legumes. Go, Caution, and Stop food choices are explained. Plant-based and lean animal proteins are emphasized. Rationale provided for low sodium intake for blood pressure control, low added sugars for blood sugar stabilization, and low added fats and oils for coronary artery disease  risk reduction and weight management.  Calorie Density  Clinical staff conducted group or individual video education with verbal and written material and guidebook.  Patient learns about calorie density and how it impacts the Pritikin Eating Plan. Knowing the characteristics of the food you choose will help you decide whether those foods will lead to weight gain or weight loss, and whether you want to consume more or less of them. Weight loss is usually a side effect of the Pritikin Eating Plan because of its focus on low calorie-dense foods.  Label Reading  Clinical staff conducted group or individual video education with verbal and written material and guidebook.  Patient learns about the Pritikin recommended label reading guidelines and corresponding recommendations regarding calorie density, added sugars, sodium content, and whole grains.  Dining Out - Part 1  Clinical staff conducted group or individual video education with verbal and written material and guidebook.  Patient learns that restaurant meals can be sabotaging because they can be so high in calories, fat, sodium,  and/or sugar. Patient learns recommended strategies on how to positively address this and avoid unhealthy pitfalls.  Facts on Fats  Clinical staff conducted group or individual video education with verbal and written material and guidebook.  Patient learns that lifestyle modifications can be just as effective, if not more so, as many medications for lowering your risk of heart disease. A Pritikin lifestyle can help to reduce your risk of inflammation and atherosclerosis (cholesterol build-up, or plaque, in the artery walls). Lifestyle interventions such as dietary choices and physical activity address the cause of atherosclerosis. A review of the types of fats and their impact on blood cholesterol levels, along with dietary recommendations to reduce fat intake is also included.  Nutrition Action Plan  Clinical staff  conducted group or individual video education with verbal and written material and guidebook.  Patient learns how to incorporate Pritikin recommendations into their lifestyle. Recommendations include planning and keeping personal health goals in mind as an important part of their success.  Healthy Mind-Set    Healthy Minds, Bodies, Hearts  Clinical staff conducted group or individual video education with verbal and written material and guidebook.  Patient learns how to identify when they are stressed. Video will discuss the impact of that stress, as well as the many benefits of stress management. Patient will also be introduced to stress management techniques. The way we think, act, and feel has an impact on our hearts.  How Our Thoughts Can Heal Our Hearts  Clinical staff conducted group or individual video education with verbal and written material and guidebook.  Patient learns that negative thoughts can cause depression and anxiety. This can result in negative lifestyle behavior and serious health problems. Cognitive behavioral therapy is an effective method to help control our thoughts in order to change and improve our emotional outlook.  Additional Videos:  Exercise    Improving Performance  Clinical staff conducted group or individual video education with verbal and written material and guidebook.  Patient learns to use a non-linear approach by alternating intensity levels and lengths of time spent exercising to help burn more calories and lose more body fat. Cardiovascular exercise helps improve heart health, metabolism, hormonal balance, blood sugar control, and recovery from fatigue. Resistance training improves strength, endurance, balance, coordination, reaction time, metabolism, and muscle mass. Flexibility exercise improves circulation, posture, and balance. Seek guidance from your physician and exercise physiologist before implementing an exercise routine and learn your capabilities  and proper form for all exercise.  Introduction to Yoga  Clinical staff conducted group or individual video education with verbal and written material and guidebook.  Patient learns about yoga, a discipline of the coming together of mind, breath, and body. The benefits of yoga include improved flexibility, improved range of motion, better posture and core strength, increased lung function, weight loss, and positive self-image. Yoga's heart health benefits include lowered blood pressure, healthier heart rate, decreased cholesterol and triglyceride levels, improved immune function, and reduced stress. Seek guidance from your physician and exercise physiologist before implementing an exercise routine and learn your capabilities and proper form for all exercise.  Medical   Aging: Enhancing Your Quality of Life  Clinical staff conducted group or individual video education with verbal and written material and guidebook.  Patient learns key strategies and recommendations to stay in good physical health and enhance quality of life, such as prevention strategies, having an advocate, securing a Health Care Proxy and Power of Attorney, and keeping a list of medications and system for  tracking them. It also discusses how to avoid risk for bone loss.  Biology of Weight Control  Clinical staff conducted group or individual video education with verbal and written material and guidebook.  Patient learns that weight gain occurs because we consume more calories than we burn (eating more, moving less). Even if your body weight is normal, you may have higher ratios of fat compared to muscle mass. Too much body fat puts you at increased risk for cardiovascular disease, heart attack, stroke, type 2 diabetes, and obesity-related cancers. In addition to exercise, following the Pritikin Eating Plan can help reduce your risk.  Decoding Lab Results  Clinical staff conducted group or individual video education with verbal and  written material and guidebook.  Patient learns that lab test reflects one measurement whose values change over time and are influenced by many factors, including medication, stress, sleep, exercise, food, hydration, pre-existing medical conditions, and more. It is recommended to use the knowledge from this video to become more involved with your lab results and evaluate your numbers to speak with your doctor.   Diseases of Our Time - Overview  Clinical staff conducted group or individual video education with verbal and written material and guidebook.  Patient learns that according to the CDC, 50% to 70% of chronic diseases (such as obesity, type 2 diabetes, elevated lipids, hypertension, and heart disease) are avoidable through lifestyle improvements including healthier food choices, listening to satiety cues, and increased physical activity.  Sleep Disorders Clinical staff conducted group or individual video education with verbal and written material and guidebook.  Patient learns how good quality and duration of sleep are important to overall health and well-being. Patient also learns about sleep disorders and how they impact health along with recommendations to address them, including discussing with a physician.  Nutrition  Dining Out - Part 2 Clinical staff conducted group or individual video education with verbal and written material and guidebook.  Patient learns how to plan ahead and communicate in order to maximize their dining experience in a healthy and nutritious manner. Included are recommended food choices based on the type of restaurant the patient is visiting.   Fueling a Banker conducted group or individual video education with verbal and written material and guidebook.  There is a strong connection between our food choices and our health. Diseases like obesity and type 2 diabetes are very prevalent and are in large-part due to lifestyle choices. The  Pritikin Eating Plan provides plenty of food and hunger-curbing satisfaction. It is easy to follow, affordable, and helps reduce health risks.  Menu Workshop  Clinical staff conducted group or individual video education with verbal and written material and guidebook.  Patient learns that restaurant meals can sabotage health goals because they are often packed with calories, fat, sodium, and sugar. Recommendations include strategies to plan ahead and to communicate with the manager, chef, or server to help order a healthier meal.  Planning Your Eating Strategy  Clinical staff conducted group or individual video education with verbal and written material and guidebook.  Patient learns about the Pritikin Eating Plan and its benefit of reducing the risk of disease. The Pritikin Eating Plan does not focus on calories. Instead, it emphasizes high-quality, nutrient-rich foods. By knowing the characteristics of the foods, we choose, we can determine their calorie density and make informed decisions.  Targeting Your Nutrition Priorities  Clinical staff conducted group or individual video education with verbal and written material and guidebook.  Patient learns that lifestyle habits have a tremendous impact on disease risk and progression. This video provides eating and physical activity recommendations based on your personal health goals, such as reducing LDL cholesterol, losing weight, preventing or controlling type 2 diabetes, and reducing high blood pressure.  Vitamins and Minerals  Clinical staff conducted group or individual video education with verbal and written material and guidebook.  Patient learns different ways to obtain key vitamins and minerals, including through a recommended healthy diet. It is important to discuss all supplements you take with your doctor.   Healthy Mind-Set    Smoking Cessation  Clinical staff conducted group or individual video education with verbal and written  material and guidebook.  Patient learns that cigarette smoking and tobacco addiction pose a serious health risk which affects millions of people. Stopping smoking will significantly reduce the risk of heart disease, lung disease, and many forms of cancer. Recommended strategies for quitting are covered, including working with your doctor to develop a successful plan.  Culinary   Becoming a Set designer conducted group or individual video education with verbal and written material and guidebook.  Patient learns that cooking at home can be healthy, cost-effective, quick, and puts them in control. Keys to cooking healthy recipes will include looking at your recipe, assessing your equipment needs, planning ahead, making it simple, choosing cost-effective seasonal ingredients, and limiting the use of added fats, salts, and sugars.  Cooking - Breakfast and Snacks  Clinical staff conducted group or individual video education with verbal and written material and guidebook.  Patient learns how important breakfast is to satiety and nutrition through the entire day. Recommendations include key foods to eat during breakfast to help stabilize blood sugar levels and to prevent overeating at meals later in the day. Planning ahead is also a key component.  Cooking - Educational psychologist conducted group or individual video education with verbal and written material and guidebook.  Patient learns eating strategies to improve overall health, including an approach to cook more at home. Recommendations include thinking of animal protein as a side on your plate rather than center stage and focusing instead on lower calorie dense options like vegetables, fruits, whole grains, and plant-based proteins, such as beans. Making sauces in large quantities to freeze for later and leaving the skin on your vegetables are also recommended to maximize your experience.  Cooking - Healthy Salads and  Dressing Clinical staff conducted group or individual video education with verbal and written material and guidebook.  Patient learns that vegetables, fruits, whole grains, and legumes are the foundations of the Pritikin Eating Plan. Recommendations include how to incorporate each of these in flavorful and healthy salads, and how to create homemade salad dressings. Proper handling of ingredients is also covered. Cooking - Soups and [employer] - Soups and Desserts Clinical staff conducted group or individual video education with verbal and written material and guidebook.  Patient learns that Pritikin soups and desserts make for easy, nutritious, and delicious snacks and meal components that are low in sodium, fat, sugar, and calorie density, while high in vitamins, minerals, and filling fiber. Recommendations include simple and healthy ideas for soups and desserts.   Overview     The Pritikin Solution Program Overview Clinical staff conducted group or individual video education with verbal and written material and guidebook.  Patient learns that the results of the Pritikin Program have been documented in more than 100 articles published in  peer-reviewed journals, and the benefits include reducing risk factors for (and, in some cases, even reversing) high cholesterol, high blood pressure, type 2 diabetes, obesity, and more! An overview of the three key pillars of the Pritikin Program will be covered: eating well, doing regular exercise, and having a healthy mind-set.  WORKSHOPS  Exercise: Exercise Basics: Building Your Action Plan Clinical staff led group instruction and group discussion with PowerPoint presentation and patient guidebook. To enhance the learning environment the use of posters, models and videos may be added. At the conclusion of this workshop, patients will comprehend the difference between physical activity and exercise, as well as the benefits of incorporating both, into  their routine. Patients will understand the FITT (Frequency, Intensity, Time, and Type) principle and how to use it to build an exercise action plan. In addition, safety concerns and other considerations for exercise and cardiac rehab will be addressed by the presenter. The purpose of this lesson is to promote a comprehensive and effective weekly exercise routine in order to improve patients' overall level of fitness.   Managing Heart Disease: Your Path to a Healthier Heart Clinical staff led group instruction and group discussion with PowerPoint presentation and patient guidebook. To enhance the learning environment the use of posters, models and videos may be added.At the conclusion of this workshop, patients will understand the anatomy and physiology of the heart. Additionally, they will understand how Pritikin's three pillars impact the risk factors, the progression, and the management of heart disease.  The purpose of this lesson is to provide a high-level overview of the heart, heart disease, and how the Pritikin lifestyle positively impacts risk factors.  Exercise Biomechanics Clinical staff led group instruction and group discussion with PowerPoint presentation and patient guidebook. To enhance the learning environment the use of posters, models and videos may be added. Patients will learn how the structural parts of their bodies function and how these functions impact their daily activities, movement, and exercise. Patients will learn how to promote a neutral spine, learn how to manage pain, and identify ways to improve their physical movement in order to promote healthy living. The purpose of this lesson is to expose patients to common physical limitations that impact physical activity. Participants will learn practical ways to adapt and manage aches and pains, and to minimize their effect on regular exercise. Patients will learn how to maintain good posture while sitting, walking, and  lifting.  Balance Training and Fall Prevention  Clinical staff led group instruction and group discussion with PowerPoint presentation and patient guidebook. To enhance the learning environment the use of posters, models and videos may be added. At the conclusion of this workshop, patients will understand the importance of their sensorimotor skills (vision, proprioception, and the vestibular system) in maintaining their ability to balance as they age. Patients will apply a variety of balancing exercises that are appropriate for their current level of function. Patients will understand the common causes for poor balance, possible solutions to these problems, and ways to modify their physical environment in order to minimize their fall risk. The purpose of this lesson is to teach patients about the importance of maintaining balance as they age and ways to minimize their risk of falling.  WORKSHOPS   Nutrition:  Fueling a Ship broker led group instruction and group discussion with PowerPoint presentation and patient guidebook. To enhance the learning environment the use of posters, models and videos may be added. Patients will review the foundational principles of the  Pritikin Eating Plan and understand what constitutes a serving size in each of the food groups. Patients will also learn Pritikin-friendly foods that are better choices when away from home and review make-ahead meal and snack options. Calorie density will be reviewed and applied to three nutrition priorities: weight maintenance, weight loss, and weight gain. The purpose of this lesson is to reinforce (in a group setting) the key concepts around what patients are recommended to eat and how to apply these guidelines when away from home by planning and selecting Pritikin-friendly options. Patients will understand how calorie density may be adjusted for different weight management goals.  Mindful Eating  Clinical staff led  group instruction and group discussion with PowerPoint presentation and patient guidebook. To enhance the learning environment the use of posters, models and videos may be added. Patients will briefly review the concepts of the Pritikin Eating Plan and the importance of low-calorie dense foods. The concept of mindful eating will be introduced as well as the importance of paying attention to internal hunger signals. Triggers for non-hunger eating and techniques for dealing with triggers will be explored. The purpose of this lesson is to provide patients with the opportunity to review the basic principles of the Pritikin Eating Plan, discuss the value of eating mindfully and how to measure internal cues of hunger and fullness using the Hunger Scale. Patients will also discuss reasons for non-hunger eating and learn strategies to use for controlling emotional eating.  Targeting Your Nutrition Priorities Clinical staff led group instruction and group discussion with PowerPoint presentation and patient guidebook. To enhance the learning environment the use of posters, models and videos may be added. Patients will learn how to determine their genetic susceptibility to disease by reviewing their family history. Patients will gain insight into the importance of diet as part of an overall healthy lifestyle in mitigating the impact of genetics and other environmental insults. The purpose of this lesson is to provide patients with the opportunity to assess their personal nutrition priorities by looking at their family history, their own health history and current risk factors. Patients will also be able to discuss ways of prioritizing and modifying the Pritikin Eating Plan for their highest risk areas  Menu  Clinical staff led group instruction and group discussion with PowerPoint presentation and patient guidebook. To enhance the learning environment the use of posters, models and videos may be added. Using menus  brought in from [employer], or printed from [employer], patients will apply the Pritikin dining out guidelines that were presented in the [employer] video. Patients will also be able to practice these guidelines in a variety of provided scenarios. The purpose of this lesson is to provide patients with the opportunity to practice hands-on learning of the Pritikin Dining Out guidelines with actual menus and practice scenarios.  Label Reading Clinical staff led group instruction and group discussion with PowerPoint presentation and patient guidebook. To enhance the learning environment the use of posters, models and videos may be added. Patients will review and discuss the Pritikin label reading guidelines presented in [employer]ritikin's Label Reading Educational series video. Using fool labels brought in from local grocery stores and markets, patients will apply the label reading guidelines and determine if the packaged food meet the Pritikin guidelines. The purpose of this lesson is to provide patients with the opportunity to review, discuss, and practice hands-on learning of the Pritikin Label Reading guidelines with actual packaged food labels. Cooking School  [employer]  Workshops are designed to teach patients ways to prepare quick, simple, and affordable recipes at home. The importance of nutrition's role in chronic disease risk reduction is reflected in its emphasis in the overall Pritikin program. By learning how to prepare essential core Pritikin Eating Plan recipes, patients will increase control over what they eat; be able to customize the flavor of foods without the use of added salt, sugar, or fat; and improve the quality of the food they consume. By learning a set of core recipes which are easily assembled, quickly prepared, and affordable, patients are more likely to prepare more healthy foods at home. These workshops focus on convenient breakfasts, simple  entres, side dishes, and desserts which can be prepared with minimal effort and are consistent with nutrition recommendations for cardiovascular risk reduction. Cooking [employer] are taught by a Armed forces logistics/support/administrative officer (RD) who has been trained by the [employer]. The chef or RD has a clear understanding of the importance of minimizing - if not completely eliminating - added fat, sugar, and sodium in recipes. Throughout the series of Cooking School Workshop sessions, patients will learn about healthy ingredients and efficient methods of cooking to build confidence in their capability to prepare    Cooking School weekly topics:  Adding Flavor- Sodium-Free  Fast and Healthy Breakfasts  Powerhouse Plant-Based Proteins  Satisfying Salads and Dressings  Simple Sides and Sauces  International Cuisine-Spotlight on the [employer] Zones  Delicious Desserts  Savory Soups  [employer] - Meals in a Astronomer Appetizers and Snacks  Comforting Weekend Breakfasts  One-Pot Wonders   Fast Evening Meals  Landscape architect Your Pritikin Plate  WORKSHOPS   Healthy Mindset (Psychosocial):  Focused Goals, Sustainable Changes Clinical staff led group instruction and group discussion with PowerPoint presentation and patient guidebook. To enhance the learning environment the use of posters, models and videos may be added. Patients will be able to apply effective goal setting strategies to establish at least one personal goal, and then take consistent, meaningful action toward that goal. They will learn to identify common barriers to achieving personal goals and develop strategies to overcome them. Patients will also gain an understanding of how our mind-set can impact our ability to achieve goals and the importance of cultivating a positive and growth-oriented mind-set. The purpose of this lesson is to provide patients with a deeper understanding of how to set and  achieve personal goals, as well as the tools and strategies needed to overcome common obstacles which may arise along the way.  From Head to Heart: The Power of a Healthy Outlook  Clinical staff led group instruction and group discussion with PowerPoint presentation and patient guidebook. To enhance the learning environment the use of posters, models and videos may be added. Patients will be able to recognize and describe the impact of emotions and mood on physical health. They will discover the importance of self-care and explore self-care practices which may work for them. Patients will also learn how to utilize the 4 C's to cultivate a healthier outlook and better manage stress and challenges. The purpose of this lesson is to demonstrate to patients how a healthy outlook is an essential part of maintaining good health, especially as they continue their cardiac rehab journey.  Healthy Sleep for a Healthy Heart Clinical staff led group instruction and group discussion with PowerPoint presentation and patient guidebook. To enhance the learning environment the use of posters, models and videos may be added.  At the conclusion of this workshop, patients will be able to demonstrate knowledge of the importance of sleep to overall health, well-being, and quality of life. They will understand the symptoms of, and treatments for, common sleep disorders. Patients will also be able to identify daytime and nighttime behaviors which impact sleep, and they will be able to apply these tools to help manage sleep-related challenges. The purpose of this lesson is to provide patients with a general overview of sleep and outline the importance of quality sleep. Patients will learn about a few of the most common sleep disorders. Patients will also be introduced to the concept of "sleep hygiene," and discover ways to self-manage certain sleeping problems through simple daily behavior changes. Finally, the workshop will motivate  patients by clarifying the links between quality sleep and their goals of heart-healthy living.   Recognizing and Reducing Stress Clinical staff led group instruction and group discussion with PowerPoint presentation and patient guidebook. To enhance the learning environment the use of posters, models and videos may be added. At the conclusion of this workshop, patients will be able to understand the types of stress reactions, differentiate between acute and chronic stress, and recognize the impact that chronic stress has on their health. They will also be able to apply different coping mechanisms, such as reframing negative self-talk. Patients will have the opportunity to practice a variety of stress management techniques, such as deep abdominal breathing, progressive muscle relaxation, and/or guided imagery.  The purpose of this lesson is to educate patients on the role of stress in their lives and to provide healthy techniques for coping with it.  Learning Barriers/Preferences:  Learning Barriers/Preferences - 01/04/23 1317       Learning Barriers/Preferences   Learning Barriers None    Learning Preferences Pictoral;Video;Written Material;Computer/Internet             Education Topics:  Knowledge Questionnaire Score:  Knowledge Questionnaire Score - 01/04/23 1322       Knowledge Questionnaire Score   Pre Score 18/24             Core Components/Risk Factors/Patient Goals at Admission:  Personal Goals and Risk Factors at Admission - 01/04/23 1324       Core Components/Risk Factors/Patient Goals on Admission    Weight Management Yes;Obesity;Weight Loss    Intervention Weight Management: Develop a combined nutrition and exercise program designed to reach desired caloric intake, while maintaining appropriate intake of nutrient and fiber, sodium and fats, and appropriate energy expenditure required for the weight goal.;Weight Management: Provide education and appropriate  resources to help participant work on and attain dietary goals.;Weight Management/Obesity: Establish reasonable short term and long term weight goals.;Obesity: Provide education and appropriate resources to help participant work on and attain dietary goals.    Admit Weight 241 lb 6.5 oz (109.5 kg)    Expected Outcomes Short Term: Continue to assess and modify interventions until short term weight is achieved;Long Term: Adherence to nutrition and physical activity/exercise program aimed toward attainment of established weight goal;Weight Loss: Understanding of general recommendations for a balanced deficit meal plan, which promotes 1-2 lb weight loss per week and includes a negative energy balance of [phone] kcal/d;Understanding recommendations for meals to include 15-35% energy as protein, 25-35% energy from fat, 35-60% energy from carbohydrates, less than 200mg  of dietary cholesterol, 20-35 gm of total fiber daily;Understanding of distribution of calorie intake throughout the day with the consumption of 4-5 meals/snacks    Hypertension Yes    Intervention Provide  education on lifestyle modifcations including regular physical activity/exercise, weight management, moderate sodium restriction and increased consumption of fresh fruit, vegetables, and low fat dairy, alcohol moderation, and smoking cessation.;Monitor prescription use compliance.    Expected Outcomes Short Term: Continued assessment and intervention until BP is < 140/14mm HG in hypertensive participants. < 130/60mm HG in hypertensive participants with diabetes, heart failure or chronic kidney disease.;Long Term: Maintenance of blood pressure at goal levels.    Lipids Yes    Intervention Provide education and support for participant on nutrition & aerobic/resistive exercise along with prescribed medications to achieve LDL 70mg , HDL >40mg .    Expected Outcomes Short Term: Participant states understanding of desired cholesterol values and is  compliant with medications prescribed. Participant is following exercise prescription and nutrition guidelines.;Long Term: Cholesterol controlled with medications as prescribed, with individualized exercise RX and with personalized nutrition plan. Value goals: LDL < 70mg , HDL > 40 mg.    Personal Goal Other Yes    Personal Goal Short: consult RD, Ex routine Long: Wt loss, stamina flexibility    Intervention Will continue to monitor pt and progress workloads as tolerated without sign or symptom    Expected Outcomes Pt will achieve his goals             Core Components/Risk Factors/Patient Goals Review:   Goals and Risk Factor Review     Row Name 01/18/23 1125 02/19/23 1808           Core Components/Risk Factors/Patient Goals Review   Personal Goals Review Weight Management/Obesity;Lipids;Hypertension Weight Management/Obesity;Lipids;Hypertension      Review Andrew Price started cardiac rehab on 01/08/23. Andrew Price is off to a good start to exercise. vital signs have been stable. Andrew Price is doing well with exercise at cardiac rehab. . vital signs have been stable.      Expected Outcomes Andrew Price will continue to participate in cardiac rehab for exercise, nutrition and lifestyle modifications Andrew Price will continue to participate in cardiac rehab for exercise, nutrition and lifestyle modifications               Core Components/Risk Factors/Patient Goals at Discharge (Final Review):   Goals and Risk Factor Review - 02/19/23 1808       Core Components/Risk Factors/Patient Goals Review   Personal Goals Review Weight Management/Obesity;Lipids;Hypertension    Review Andrew Price is doing well with exercise at cardiac rehab. . vital signs have been stable.    Expected Outcomes Andrew Price will continue to participate in cardiac rehab for exercise, nutrition and lifestyle modifications             ITP Comments:  ITP Comments     Row Name 01/04/23 1058 01/18/23 1108 02/19/23 1806       ITP Comments Dr.  Armanda Magic medical director. Introduction to pritikin education/intensive cardiac rehab. Initial orientation packet reviewed with patient. 30 Day ITP Review. Andrew Price started cardiac rehab on 01/08/23. Andrew Price does well with participation when in attendance. 30 Day ITP Review. Andrew Price started cardiac rehab on 01/08/23. Andrew Price continues to do well with participation  in cardiac rehab when in attendance.              Comments: See ITP comments.Andrew Headings RN BSN

## 2023-02-21 ENCOUNTER — Encounter (HOSPITAL_COMMUNITY): Payer: 59

## 2023-02-23 ENCOUNTER — Encounter (HOSPITAL_COMMUNITY): Payer: 59

## 2023-02-26 ENCOUNTER — Encounter (HOSPITAL_COMMUNITY): Payer: 59

## 2023-02-28 ENCOUNTER — Encounter (HOSPITAL_COMMUNITY)
Admission: RE | Admit: 2023-02-28 | Discharge: 2023-02-28 | Disposition: A | Discharge status: Home / Self Care | Payer: 59 | Source: Ambulatory Visit | Attending: Cardiology | Admitting: Cardiology

## 2023-02-28 DIAGNOSIS — Z955 Presence of coronary angioplasty implant and graft: Secondary | ICD-10-CM | POA: Diagnosis not present

## 2023-03-02 ENCOUNTER — Encounter (HOSPITAL_COMMUNITY): Payer: 59

## 2023-03-05 ENCOUNTER — Encounter (HOSPITAL_COMMUNITY)
Admission: RE | Admit: 2023-03-05 | Discharge: 2023-03-05 | Disposition: A | Discharge status: Home / Self Care | Payer: 59 | Source: Ambulatory Visit | Attending: Cardiology | Admitting: Cardiology

## 2023-03-05 DIAGNOSIS — Z48812 Encounter for surgical aftercare following surgery on the circulatory system: Secondary | ICD-10-CM | POA: Insufficient documentation

## 2023-03-05 DIAGNOSIS — Z955 Presence of coronary angioplasty implant and graft: Secondary | ICD-10-CM | POA: Insufficient documentation

## 2023-03-07 ENCOUNTER — Encounter (HOSPITAL_COMMUNITY): Payer: 59

## 2023-03-09 ENCOUNTER — Encounter (HOSPITAL_COMMUNITY): Payer: 59

## 2023-03-11 NOTE — Progress Notes (Unsigned)
  Cardiology Office Note:   Date:  03/11/2023  ID:  Andrew Price, DOB 10-23-1965, MRN [id number] PCP: Henrine Screws, MD  [facility] Providers Cardiologist:  Rollene Rotunda, MD Cardiology APP:  Marcelino Duster, [place] {  History of Present Illness:   Andrew Price is a 57 y.o. male  who presents for evaluation of CAD.  He had new onset SOB and was found to have proximal LAD stenosis and is status post stenting in Sept 2024.  ***   ***   SOB.  He is referred by Sheliah Hatch, PA-C .  He has no prior cardiac history.  He does have borderline blood sugars.  He has hypertriglyceridemia.  He was in the hospital in May with pancreatitis.  He has not had any prior cardiac workup other than I saw an echo years ago in 2014.  He does not really recall the details of this but I do see that he had normal left ventricular ejection fraction and some borderline LVH.  There was trivial mitral and tricuspid regurgitation.  ***   ***  He had COVID about 3 weeks ago.  Following this he has had tightness in his upper chest and through his jaw.  This is sporadic.  It comes on at rest.  It also happens while he is riding his electric bike.  He says he can keep riding the bike and it will go away but it will come on and can be 6 out of 10 in intensity.  He gets a little nauseated.  He does not have any bad diaphoresis and has not had any significant shortness of breath though he feels like he has to take a deep breath sometimes when this happens.  He had 1 episode of dizziness and sweating unrelated to this.  He otherwise does not feel tachypalpitations.  He said no presyncope or syncope.      ROS: ***  Studies Reviewed:    EKG:       ***  Risk Assessment/Calculations:   {Does this patient have ATRIAL FIBRILLATION?:[phone]} No BP recorded.  {Refresh Note OR Click here to enter BP  :1}***        Physical Exam:   VS:  There were no vitals taken for this visit.   Wt  Readings from Last 3 Encounters:  01/04/23 241 lb 6.5 oz (109.5 kg)  01/01/23 239 lb (108.4 kg)  12/21/22 235 lb (106.6 kg)     GEN: Well nourished, well developed in no acute distress NECK: No JVD; No carotid bruits CARDIAC: ***RR, *** murmurs, rubs, gallops RESPIRATORY:  Clear to auscultation without rales, wheezing or rhonchi  ABDOMEN: Soft, non-tender, non-distended EXTREMITIES:  No edema; No deformity   ASSESSMENT AND PLAN:   CAD:   ***  The patient has jaw and upper chest discomfort.  The pretest probability of obstructive coronary disease is at least moderate.  Coronary CTA is indicated.  Further management will be based on these results.   SLEEP APNEA:   ***  He is awaiting a new sleep study as he had recall of his mask.   RISK REDUCTION:   ***  he patient had an HDL of 31 and LDL of 60.  Goals of therapy will be based on results of the findings above.     Follow up ***  Signed, Rollene Rotunda, MD

## 2023-03-12 ENCOUNTER — Telehealth (HOSPITAL_COMMUNITY): Payer: Self-pay

## 2023-03-12 ENCOUNTER — Encounter (HOSPITAL_COMMUNITY): Payer: 59

## 2023-03-12 ENCOUNTER — Encounter: Payer: Self-pay | Admitting: Cardiology

## 2023-03-12 ENCOUNTER — Ambulatory Visit: Payer: 59 | Attending: Cardiology | Admitting: Cardiology

## 2023-03-12 VITALS — BP 132/82 | HR 60 | Ht 70.0 in | Wt 253.0 lb

## 2023-03-12 DIAGNOSIS — G4730 Sleep apnea, unspecified: Secondary | ICD-10-CM | POA: Diagnosis not present

## 2023-03-12 DIAGNOSIS — Z9861 Coronary angioplasty status: Secondary | ICD-10-CM

## 2023-03-12 DIAGNOSIS — I2510 Atherosclerotic heart disease of native coronary artery without angina pectoris: Secondary | ICD-10-CM

## 2023-03-12 NOTE — Progress Notes (Signed)
Cardiac Individual Treatment Plan  Patient Details  Name: Andrew Price MRN: [id number] Date of Birth: June 12, 1965 Referring Provider:   Flowsheet Row INTENSIVE CARDIAC REHAB ORIENT from 01/04/2023 in [facility]  Referring Provider Dr. Rollene Rotunda MD       Initial Encounter Date:  Flowsheet Row INTENSIVE CARDIAC REHAB ORIENT from 01/04/2023 in [facility] for Heart, Vascular, & Lung [facility]  Date 01/04/23       Visit Diagnosis: 12/21/22 DES LAD  Patient's Home Medications on Admission:  Current Outpatient Medications:    aspirin EC (ASPIRIN EC ADULT LOW DOSE) 81 MG tablet, Take 1 tablet (81 mg total) by mouth daily. Swallow whole., Disp: 90 tablet, Rfl: 3   clopidogrel (PLAVIX) 75 MG tablet, Take 1 tablet (75 mg total) by mouth daily with breakfast., Disp: 90 tablet, Rfl: 3   colchicine 0.6 MG tablet, Take 0.6 mg by mouth daily as needed (gout)., Disp: , Rfl:    icosapent Ethyl (VASCEPA) 1 g capsule, Take 2 capsules (2 g total) by mouth 2 (two) times daily., Disp: 120 capsule, Rfl: 11   lisinopril (ZESTRIL) 10 MG tablet, Take 1 tablet (10 mg total) by mouth daily., Disp: 90 tablet, Rfl: 3   loratadine (CLARITIN) 10 MG tablet, Take 10 mg by mouth daily as needed for allergies., Disp: , Rfl:    Multiple Vitamins-Minerals (MULTIVITAMIN WITH MINERALS) tablet, Take 1 tablet by mouth daily. Men 50+, Disp: , Rfl:    neomycin-polymyxin-dexameth (MAXITROL) 0.1 % OINT, Place 1 Application into both eyes once a week. Eye lid, Disp: , Rfl:    nitroGLYCERIN (NITROSTAT) 0.4 MG SL tablet, Place 1 tablet (0.4 mg total) under the tongue every 5 (five) minutes as needed for chest pain., Disp: 25 tablet, Rfl: 2   rosuvastatin (CRESTOR) 40 MG tablet, Take 1 tablet (40 mg total) by mouth daily., Disp: 90 tablet, Rfl: 3  Past Medical History: Past Medical History:  Diagnosis Date   Sullivan Lone syndrome    Gout     Hypertension    Mild sleep apnea    Obesity (BMI 30-39.9)     Tobacco Use: Social History   Tobacco Use  Smoking Status Never  Smokeless Tobacco Not on file    Labs: Review Flowsheet       Latest Ref Rng & Units 12/20/2022 02/01/2023  Labs for ITP Cardiac and Pulmonary Rehab  Cholestrol 100 - 199 mg/dL - 696   LDL (calc) 0 - 99 mg/dL - 33   HDL-C >78 mg/dL - 32   Trlycerides 0 - 149 mg/dL - 938   Hemoglobin B0F 4.8 - 5.6 % 6.1  -    Details            Capillary Blood Glucose: Lab Results  Component Value Date   GLUCAP 211 (H) 08/07/2022   GLUCAP 236 (H) 08/06/2022   GLUCAP 225 (H) 08/06/2022   GLUCAP 193 (H) 08/05/2022     Exercise Target Goals: Exercise Program Goal: Individual exercise prescription set using results from initial 6 min walk test and THRR while considering  patient's activity barriers and safety.   Exercise Prescription Goal: Starting with aerobic activity 30 plus minutes a day, 3 days per week for initial exercise prescription. Provide home exercise prescription and guidelines that participant acknowledges understanding prior to discharge.  Activity Barriers & Risk Stratification:  Activity Barriers & Cardiac Risk Stratification - 01/04/23 1307  Activity Barriers & Cardiac Risk Stratification   Activity Barriers Back Problems;Deconditioning    Cardiac Risk Stratification High   under 5 MET's            6 Minute Walk:  6 Minute Walk     Row Name 01/04/23 1250         6 Minute Walk   Phase Initial     Distance 1610 feet     Walk Time 6 minutes     # of Rest Breaks 0     MPH 3.04     METS 4.03     RPE 8     Perceived Dyspnea  0     VO2 Peak 14.12     Symptoms No     Resting HR 66 bpm     Resting BP 148/86     Resting Oxygen Saturation  99 %     Exercise Oxygen Saturation  during 6 min walk 100 %     Max Ex. HR 113 bpm     Max Ex. BP 150/86     2 Minute Post BP 144/84              Oxygen Initial  Assessment:   Oxygen Re-Evaluation:   Oxygen Discharge (Final Oxygen Re-Evaluation):   Initial Exercise Prescription:  Initial Exercise Prescription - 01/04/23 1300       Date of Initial Exercise RX and Referring Provider   Date 01/04/23    Referring Provider Dr. Rollene Rotunda MD    Expected Discharge Date 03/26/23      Recumbant Bike   Level 2    RPM 60    Watts 25    Minutes 15    METs 2      Arm Ergometer   Level 2    Watts 15    RPM 45    Minutes 15    METs 1.9      Prescription Details   Frequency (times per week) 3    Duration Progress to 30 minutes of continuous aerobic without signs/symptoms of physical distress      Intensity   THRR 40-80% of Max Heartrate 65-131    Ratings of Perceived Exertion 11-13    Perceived Dyspnea 0-4      Progression   Progression Continue progressive overload as per policy without signs/symptoms or physical distress.      Resistance Training   Training Prescription Yes    Weight 4    Reps 10-15             Perform Capillary Blood Glucose checks as needed.  Exercise Prescription Changes:   Exercise Prescription Changes     Row Name 01/08/23 0845 01/24/23 1415 02/12/23 0843 03/05/23 0830       Response to Exercise   Blood Pressure (Admit) 128/82 124/78 134/78 122/64    Blood Pressure (Exercise) 140/68 -- 142/72 --    Blood Pressure (Exit) 122/70 128/72 136/78 130/80    Heart Rate (Admit) 72 bpm 67 bpm 70 bpm 69 bpm    Heart Rate (Exercise) 97 bpm 131 bpm 141 bpm 138 bpm    Heart Rate (Exit) 77 bpm 74 bpm 84 bpm 78 bpm    Rating of Perceived Exertion (Exercise) 9.5 10 11 12     Perceived Dyspnea (Exercise) 0 0 0 0    Symptoms 0 0 0 0    Comments Pt first day in the [employer] program Reviewed MET's, goals and home ExRx Reviewed  MET's Reviewed MET's    Duration Progress to 30 minutes of  aerobic without signs/symptoms of physical distress Progress to 30 minutes of  aerobic without signs/symptoms of physical  distress Progress to 30 minutes of  aerobic without signs/symptoms of physical distress Progress to 30 minutes of  aerobic without signs/symptoms of physical distress    Intensity THRR unchanged THRR unchanged THRR unchanged THRR unchanged      Progression   Progression Continue to progress workloads to maintain intensity without signs/symptoms of physical distress. Continue to progress workloads to maintain intensity without signs/symptoms of physical distress. Continue to progress workloads to maintain intensity without signs/symptoms of physical distress. Continue to progress workloads to maintain intensity without signs/symptoms of physical distress.    Average METs 1.9 3.8 4.86 4.99      Resistance Training   Training Prescription Yes No No Yes    Weight 4 4 5 5     Reps 10-15 10-15 10-15 10-15    Time 10 Minutes 10 Minutes 10 Minutes 10 Minutes      Recumbant Bike   Level 2 -- 3 4    RPM 80 -- 78 79    Watts 19 -- 47 69    Minutes 15 -- 15 15    METs 2.2 -- 3.3 3.7      Arm Ergometer   Level 2 2 -- --    Watts -- 30 -- --    RPM -- 58 -- --    Minutes 15 15 -- --    METs 1.6 2.3 -- --      Rower   Level -- 1 3 4     Watts -- 57 -- 92    Minutes -- 15 15 15     METs -- 5.3 6.42 6.28      Home Exercise Plan   Plans to continue exercise at -- Home (comment) Home (comment) Home (comment)    Frequency -- Add 3 additional days to program exercise sessions. Add 3 additional days to program exercise sessions. Add 3 additional days to program exercise sessions.    Initial Home Exercises Provided -- 01/24/23 01/24/23 01/24/23             Exercise Comments:   Exercise Comments     Row Name 01/08/23 0851 01/24/23 1420 02/12/23 0846 03/05/23 1646     Exercise Comments Pt first day in the [facility] program. Pt tolerated exercise well with an average MET level of 1.9. Pt is learning his THRR, RPE and ExRx. Off to a great start. Reviewed MET's, goals and home ExRx. Pt  tolerated exercise well with an average MET level of 3.8. Pt is feeling good about his goals and is gaining strength and stamina. Gave pt a packet on stretches to work on flexibility. He also reached out to the RD today for a one on one consult. He is already exercising on his own by walking, E-Bike and stretching 2-3 days for 30-45 mins per session Reviewed MET's, goals and home ExRx. Pt tolerated exercise well with an average MET level of 3.8. Pt is doing well and progressing MET's. He is beginning to reach the upper limits of his THRR, will continue to monitor to see if a THRR INC may be needed in the future. Reviewed MET's. Pt tolerated exercise well with an average MET level of 4.99. Pt is doing well and progressing MET's.             Exercise Goals and Review:  Exercise Goals     Row Name 01/04/23 1313             Exercise Goals   Increase Physical Activity Yes       Intervention Provide advice, education, support and counseling about physical activity/exercise needs.;Develop an individualized exercise prescription for aerobic and resistive training based on initial evaluation findings, risk stratification, comorbidities and participant's personal goals.       Expected Outcomes Short Term: Attend rehab on a regular basis to increase amount of physical activity.;Long Term: Exercising regularly at least 3-5 days a week.;Long Term: Add in home exercise to make exercise part of routine and to increase amount of physical activity.       Increase Strength and Stamina Yes       Intervention Provide advice, education, support and counseling about physical activity/exercise needs.;Develop an individualized exercise prescription for aerobic and resistive training based on initial evaluation findings, risk stratification, comorbidities and participant's personal goals.       Expected Outcomes Short Term: Increase workloads from initial exercise prescription for resistance, speed, and METs.;Short  Term: Perform resistance training exercises routinely during rehab and add in resistance training at home;Long Term: Improve cardiorespiratory fitness, muscular endurance and strength as measured by increased METs and functional capacity ( )       Able to understand and use rate of perceived exertion (RPE) scale Yes       Intervention Provide education and explanation on how to use RPE scale       Expected Outcomes Short Term: Able to use RPE daily in rehab to express subjective intensity level;Long Term:  Able to use RPE to guide intensity level when exercising independently       Knowledge and understanding of Target Heart Rate Range (THRR) Yes       Intervention Provide education and explanation of THRR including how the numbers were predicted and where they are located for reference       Expected Outcomes Short Term: Able to state/look up THRR;Long Term: Able to use THRR to govern intensity when exercising independently;Short Term: Able to use daily as guideline for intensity in rehab       Understanding of Exercise Prescription Yes       Intervention Provide education, explanation, and written materials on patient's individual exercise prescription       Expected Outcomes Short Term: Able to explain program exercise prescription;Long Term: Able to explain home exercise prescription to exercise independently                Exercise Goals Re-Evaluation :  Exercise Goals Re-Evaluation     Row Name 01/08/23 0850 01/24/23 0830           Exercise Goal Re-Evaluation   Exercise Goals Review Increase Physical Activity;Understanding of Exercise Prescription;Increase Strength and Stamina;Knowledge and understanding of Target Heart Rate Range (THRR);Able to understand and use rate of perceived exertion (RPE) scale Increase Physical Activity;Understanding of Exercise Prescription;Increase Strength and Stamina;Knowledge and understanding of Target Heart Rate Range (THRR);Able to understand and  use rate of perceived exertion (RPE) scale      Comments Pt first day in the [facility] program. Pt tolerated exercise well with an average MET level of 1.9. Pt is learning his THRR, RPE and ExRx. Off to a great start. Reviewed MET's, goals and home ExRx. Pt tolerated exercise well with an average MET level of 3.8. Pt is feeling good about his goals and is gaining strength and stamina. Gave pt  a packet on stretches to work on flexibility. He also reached out to the RD today for a one on one consult. He is already exercising on his own by walking, E-Bike and stretching 2-3 days for 30-45 mins per session      Expected Outcomes Will continue to monitor pt and progress workloads as tolerated without sign or symptom Will continue to monitor pt and progress workloads as tolerated without sign or symptom                Discharge Exercise Prescription (Final Exercise Prescription Changes):  Exercise Prescription Changes - 03/05/23 0830       Response to Exercise   Blood Pressure (Admit) 122/64    Blood Pressure (Exit) 130/80    Heart Rate (Admit) 69 bpm    Heart Rate (Exercise) 138 bpm    Heart Rate (Exit) 78 bpm    Rating of Perceived Exertion (Exercise) 12    Perceived Dyspnea (Exercise) 0    Symptoms 0    Comments Reviewed MET's    Duration Progress to 30 minutes of  aerobic without signs/symptoms of physical distress    Intensity THRR unchanged      Progression   Progression Continue to progress workloads to maintain intensity without signs/symptoms of physical distress.    Average METs 4.99      Resistance Training   Training Prescription Yes    Weight 5    Reps 10-15    Time 10 Minutes      Recumbant Bike   Level 4    RPM 79    Watts 69    Minutes 15    METs 3.7      Rower   Level 4    Watts 92    Minutes 15    METs 6.28      Home Exercise Plan   Plans to continue exercise at Home (comment)    Frequency Add 3 additional days to program exercise sessions.     Initial Home Exercises Provided 01/24/23             Nutrition:  Target Goals: Understanding of nutrition guidelines, daily intake of sodium 1500mg , cholesterol 200mg , calories 30% from fat and 7% or less from saturated fats, daily to have 5 or more servings of fruits and vegetables.  Biometrics:  Pre Biometrics - 01/04/23 1314       Pre Biometrics   Height 5\' 10"  (1.778 m)    Weight 109.5 kg    Waist Circumference 46 inches    Hip Circumference 40.25 inches    Waist to Hip Ratio 1.14 %    BMI (Calculated) 34.64    Triceps Skinfold 12 mm    % Body Fat 31.4 %    Grip Strength 42 kg    Flexibility 11.75 in    Single Leg Stand 30 seconds              Nutrition Therapy Plan and Nutrition Goals:  Nutrition Therapy & Goals - 02/09/23 0919       Nutrition Therapy   Diet Heart healthy diet    Drug/Food Interactions Statins/Certain Fruits      Personal Nutrition Goals   Nutrition Goal Patient to identify strategies for reducing cardiovascular risk by attending the [facility] education and nutrition series weekly.   goal in progress.   Personal Goal #2 Patient to improve diet quality by using the plate method as a guide for meal planning to include lean protein/plant protein,  fruits, vegetables, whole grains, nonfat dairy as part of a well-balanced diet.   goal in progress.   Personal Goal #3 Patient to reduce sodium intake to 1500-2000mg  per day.   goal in progress.   Comments Goals in progress. However, patient's attendance to intensive cardiac rehab remains variable due to travel for work. He has attended 7 exercise sessions and 3 education sessions. [name] has medical history of stent placement, hyperlipidemia, HTN,obesity. He has been started on Vascepa for elevated triglycerides. A1c remains in a pre-diabetic range. He remains motivated to lose weight but admits travel for work is a barrier to [employer]. He is up 1.1# since starting with our program. Patient  will benefit from participation in intensive cardiac rehab for nutrition, exercise, and lifestyle modification.      Intervention Plan   Intervention Nutrition handout(s) given to patient.;Prescribe, educate and counsel regarding individualized specific dietary modifications aiming towards targeted core components such as weight, hypertension, lipid management, diabetes, heart failure and other comorbidities.    Expected Outcomes Short Term Goal: Understand basic principles of dietary content, such as calories, fat, sodium, cholesterol and nutrients.;Long Term Goal: Adherence to prescribed nutrition plan.             Nutrition Assessments:  Nutrition Assessments - 01/05/23 1418       Rate Your Plate Scores   Pre Score 73            MEDIFICTS Score Key: >=70 Need to make dietary changes  40-70 Heart Healthy Diet <= 40 Therapeutic Level Cholesterol Diet  Flowsheet Row INTENSIVE CARDIAC REHAB ORIENT from 01/04/2023 in [facility] for Heart, Vascular, & Lung Health  Picture Your Plate Total Score on Admission 73      Picture Your Plate Scores: <82 Unhealthy dietary pattern with much room for improvement. 41-50 Dietary pattern unlikely to meet recommendations for good health and room for improvement. 51-60 More healthful dietary pattern, with some room for improvement.  >60 Healthy dietary pattern, although there may be some specific behaviors that could be improved.    Nutrition Goals Re-Evaluation:  Nutrition Goals Re-Evaluation     Row Name 01/08/23 1016 02/09/23 0919           Goals   Current Weight 241 lb 6.5 oz (109.5 kg) 242 lb 8.1 oz (110 kg)  weight from last attended session on 01/31/23      Comment A1c 6.1, cholestrol 191, HDL 31, LDL 61, triglycerides 663 no new labs; most recent labs A1c 6.1, cholestrol 191, HDL 31, LDL 61, triglycerides 663      Expected Outcome [name] has medical history of stent placement, hyperlipidemia,  HTN,obesity. He has been started on Vascepa for elevated triglycerides. A1c remains in a pre-diabetic range. Patient will benefit from participation in intensive cardiac rehab for nutrition, exercise, and lifestyle modification. Goals in progress. However, patient's attendance to intensive cardiac rehab remains variable due to travel for work. He has attended 7 exercise sessions and 3 education sessions. [name] has medical history of stent placement, hyperlipidemia, HTN,obesity. He has been started on Vascepa for elevated triglycerides. A1c remains in a pre-diabetic range. He remains motivated to lose weight but admits travel for work is a barrier to [employer]. He is up 1.1# since starting with our program. Patient will benefit from participation in intensive cardiac rehab for nutrition, exercise, and lifestyle modification.               Nutrition Goals Discharge (  Final Nutrition Goals Re-Evaluation):  Nutrition Goals Re-Evaluation - 02/09/23 0919       Goals   Current Weight 242 lb 8.1 oz (110 kg)   weight from last attended session on 01/31/23   Comment no new labs; most recent labs A1c 6.1, cholestrol 191, HDL 31, LDL 61, triglycerides 663    Expected Outcome Goals in progress. However, patient's attendance to intensive cardiac rehab remains variable due to travel for work. He has attended 7 exercise sessions and 3 education sessions. [name] has medical history of stent placement, hyperlipidemia, HTN,obesity. He has been started on Vascepa for elevated triglycerides. A1c remains in a pre-diabetic range. He remains motivated to lose weight but admits travel for work is a barrier to [employer]. He is up 1.1# since starting with our program. Patient will benefit from participation in intensive cardiac rehab for nutrition, exercise, and lifestyle modification.             Psychosocial: Target Goals: Acknowledge presence or absence of significant depression and/or  stress, maximize coping skills, provide positive support system. Participant is able to verbalize types and ability to use techniques and skills needed for reducing stress and depression.  Initial Review & Psychosocial Screening:  Initial Psych Review & Screening - 01/04/23 1329       Initial Review   Current issues with Current Sleep Concerns   Some muscle aches, and chages in sleeping pattrers, but not due to stress or anxiety     Family Dynamics   Good Support System? Yes   Wife and daughter are a good support system     Barriers   Psychosocial barriers to participate in program There are no identifiable barriers or psychosocial needs.;The patient should benefit from training in stress management and relaxation.      Screening Interventions   Interventions Encouraged to exercise;Provide feedback about the scores to participant    Expected Outcomes Long Term Goal: Stressors or current issues are controlled or eliminated.;Short Term goal: Identification and review with participant of any Quality of Life or Depression concerns found by scoring the questionnaire.;Long Term goal: The participant improves quality of Life and PHQ9 Scores as seen by post scores and/or verbalization of changes             Quality of Life Scores:  Quality of Life - 01/04/23 1316       Quality of Life   Select Quality of Life      Quality of Life Scores   Health/Function Pre 27.1 %    Socioeconomic Pre 27.36 %    Psych/Spiritual Pre 29.14 %    Family Pre 28.8 %    GLOBAL Pre 27.82 %            Scores of 19 and below usually indicate a poorer quality of life in these areas.  A difference of  2-3 points is a clinically meaningful difference.  A difference of 2-3 points in the total score of the Quality of Life Index has been associated with significant improvement in overall quality of life, self-image, physical symptoms, and general health in studies assessing change in quality of  life.  PHQ-9: Review Flowsheet       01/04/2023  Depression screen PHQ 2/9  Decreased Interest 0  Down, Depressed, Hopeless 0  PHQ - 2 Score 0  Altered sleeping 1  Tired, decreased energy 0  Change in appetite 0  Feeling bad or failure about yourself  0  Trouble concentrating 0  Moving slowly or fidgety/restless 0  Suicidal thoughts 0  PHQ-9 Score 1    Details           Interpretation of Total Score  Total Score Depression Severity:  1-4 = Minimal depression, 5-9 = Mild depression, 10-14 = Moderate depression, 15-19 = Moderately severe depression, 20-27 = Severe depression   Psychosocial Evaluation and Intervention:   Psychosocial Re-Evaluation:  Psychosocial Re-Evaluation     Row Name 01/18/23 1124 02/19/23 1806 03/06/23 1423         Psychosocial Re-Evaluation   Current issues with None Identified None Identified None Identified     Interventions Encouraged to attend Cardiac Rehabilitation for the exercise Encouraged to attend Cardiac Rehabilitation for the exercise Encouraged to attend Cardiac Rehabilitation for the exercise     Continue Psychosocial Services  No Follow up required No Follow up required No Follow up required              Psychosocial Discharge (Final Psychosocial Re-Evaluation):  Psychosocial Re-Evaluation - 03/06/23 1423       Psychosocial Re-Evaluation   Current issues with None Identified    Interventions Encouraged to attend Cardiac Rehabilitation for the exercise    Continue Psychosocial Services  No Follow up required             Vocational Rehabilitation: Provide vocational rehab assistance to qualifying candidates.   Vocational Rehab Evaluation & Intervention:  Vocational Rehab - 01/04/23 1323       Initial Vocational Rehab Evaluation & Intervention   Assessment shows need for Vocational Rehabilitation No   No needs, he is back at work            Education: Education Goals: Education classes will be provided  on a weekly basis, covering required topics. Participant will state understanding/return demonstration of topics presented.  Learning Barriers/Preferences:  Learning Barriers/Preferences - 01/04/23 1317       Learning Barriers/Preferences   Learning Barriers None    Learning Preferences Pictoral;Video;Written Material;Computer/Internet             Education Topics: Hypertension, Hypertension Reduction -Define heart disease and high blood pressure. Discus how high blood pressure affects the body and ways to reduce high blood pressure.   Exercise and Your Heart -Discuss why it is important to exercise, the FITT principles of exercise, normal and abnormal responses to exercise, and how to exercise safely.   Angina -Discuss definition of angina, causes of angina, treatment of angina, and how to decrease risk of having angina.   Cardiac Medications -Review what the following cardiac medications are used for, how they affect the body, and side effects that may occur when taking the medications.  Medications include Aspirin, Beta blockers, calcium channel blockers, ACE Inhibitors, angiotensin receptor blockers, diuretics, digoxin, and antihyperlipidemics.   Congestive Heart Failure -Discuss the definition of CHF, how to live with CHF, the signs and symptoms of CHF, and how keep track of weight and sodium intake.   Heart Disease and Intimacy -Discus the effect sexual activity has on the heart, how changes occur during intimacy as we age, and safety during sexual activity.   Smoking Cessation / COPD -Discuss different methods to quit smoking, the health benefits of quitting smoking, and the definition of COPD.   Nutrition I: Fats -Discuss the types of cholesterol, what cholesterol does to the heart, and how cholesterol levels can be controlled.   Nutrition II: Labels -Discuss the different components of food labels and how to read food label  Heart Parts/Heart Disease and  PAD -Discuss the anatomy of the heart, the pathway of blood circulation through the heart, and these are affected by heart disease.   Stress I: Signs and Symptoms -Discuss the causes of stress, how stress may lead to anxiety and depression, and ways to limit stress.   Stress II: Relaxation -Discuss different types of relaxation techniques to limit stress.   Warning Signs of Stroke / TIA -Discuss definition of a stroke, what the signs and symptoms are of a stroke, and how to identify when someone is having stroke.   Knowledge Questionnaire Score:  Knowledge Questionnaire Score - 01/04/23 1322       Knowledge Questionnaire Score   Pre Score 18/24             Core Components/Risk Factors/Patient Goals at Admission:  Personal Goals and Risk Factors at Admission - 01/04/23 1324       Core Components/Risk Factors/Patient Goals on Admission    Weight Management Yes;Obesity;Weight Loss    Intervention Weight Management: Develop a combined nutrition and exercise program designed to reach desired caloric intake, while maintaining appropriate intake of nutrient and fiber, sodium and fats, and appropriate energy expenditure required for the weight goal.;Weight Management: Provide education and appropriate resources to help participant work on and attain dietary goals.;Weight Management/Obesity: Establish reasonable short term and long term weight goals.;Obesity: Provide education and appropriate resources to help participant work on and attain dietary goals.    Admit Weight 241 lb 6.5 oz (109.5 kg)    Expected Outcomes Short Term: Continue to assess and modify interventions until short term weight is achieved;Long Term: Adherence to nutrition and physical activity/exercise program aimed toward attainment of established weight goal;Weight Loss: Understanding of general recommendations for a balanced deficit meal plan, which promotes 1-2 lb weight loss per week and includes a negative energy  balance of [phone] kcal/d;Understanding recommendations for meals to include 15-35% energy as protein, 25-35% energy from fat, 35-60% energy from carbohydrates, less than 200mg  of dietary cholesterol, 20-35 gm of total fiber daily;Understanding of distribution of calorie intake throughout the day with the consumption of 4-5 meals/snacks    Hypertension Yes    Intervention Provide education on lifestyle modifcations including regular physical activity/exercise, weight management, moderate sodium restriction and increased consumption of fresh fruit, vegetables, and low fat dairy, alcohol moderation, and smoking cessation.;Monitor prescription use compliance.    Expected Outcomes Short Term: Continued assessment and intervention until BP is < 140/2mm HG in hypertensive participants. < 130/26mm HG in hypertensive participants with diabetes, heart failure or chronic kidney disease.;Long Term: Maintenance of blood pressure at goal levels.    Lipids Yes    Intervention Provide education and support for participant on nutrition & aerobic/resistive exercise along with prescribed medications to achieve LDL 70mg , HDL >40mg .    Expected Outcomes Short Term: Participant states understanding of desired cholesterol values and is compliant with medications prescribed. Participant is following exercise prescription and nutrition guidelines.;Long Term: Cholesterol controlled with medications as prescribed, with individualized exercise RX and with personalized nutrition plan. Value goals: LDL < 70mg , HDL > 40 mg.    Personal Goal Other Yes    Personal Goal Short: consult RD, Ex routine Long: Wt loss, stamina flexibility    Intervention Will continue to monitor pt and progress workloads as tolerated without sign or symptom    Expected Outcomes Pt will achieve his goals             Core Components/Risk Factors/Patient Goals Review:  Goals and Risk Factor Review     Row Name 01/18/23 1125 02/19/23 1808 03/06/23  1424         Core Components/Risk Factors/Patient Goals Review   Personal Goals Review Weight Management/Obesity;Lipids;Hypertension Weight Management/Obesity;Lipids;Hypertension Weight Management/Obesity;Lipids;Hypertension     Review [name] started cardiac rehab on 01/08/23. [name] is off to a good start to exercise. vital signs have been stable. [name] is doing well with exercise at cardiac rehab. . vital signs have been stable. [name] continues to do  well with exercise at cardiac rehab. vital signs remain stable. [name] has gained 2.5 kg since starting cardiac rehab     Expected Outcomes [name] will continue to participate in cardiac rehab for exercise, nutrition and lifestyle modifications [name] will continue to participate in cardiac rehab for exercise, nutrition and lifestyle modifications [name] will continue to participate in cardiac rehab for exercise, nutrition and lifestyle modifications              Core Components/Risk Factors/Patient Goals at Discharge (Final Review):   Goals and Risk Factor Review - 03/06/23 1424       Core Components/Risk Factors/Patient Goals Review   Personal Goals Review Weight Management/Obesity;Lipids;Hypertension    Review [name] continues to do  well with exercise at cardiac rehab. vital signs remain stable. [name] has gained 2.5 kg since starting cardiac rehab    Expected Outcomes [name] will continue to participate in cardiac rehab for exercise, nutrition and lifestyle modifications             ITP Comments:  ITP Comments     Row Name 01/04/23 1058 01/18/23 1108 02/19/23 1806 03/06/23 1422     ITP Comments Dr. Armanda Magic medical director. Introduction to pritikin education/intensive cardiac rehab. Initial orientation packet reviewed with patient. 30 Day ITP Review. [name] started cardiac rehab on 01/08/23. [name] does well with participation when in attendance. 30 Day ITP Review. [name] started cardiac rehab on 01/08/23. [name] continues to do well  with participation  in cardiac rehab when in attendance. 30 Day ITP review.  [name] continues to do well with participation  in cardiac rehab when in attendance.             Comments: See ITP Comment

## 2023-03-12 NOTE — Telephone Encounter (Signed)
Attempted to call pt in regards to Cardiac rehab Attendance and see when he will be back! LM on VM

## 2023-03-12 NOTE — Patient Instructions (Signed)
 Medication Instructions:  No changes.  *If you need a refill on your cardiac medications before your next appointment, please call your pharmacy*     Follow-Up: At [facility], you and your health needs are our priority.  As part of our continuing mission to provide you with exceptional heart care, we have created designated Provider Care Teams.  These Care Teams include your primary Cardiologist (physician) and Advanced Practice Providers (APPs -  Physician Assistants and Nurse Practitioners) who all work together to provide you with the care you need, when you need it.   Your next appointment:   6 month(s)  Provider:   Rollene Rotunda, MD

## 2023-03-13 ENCOUNTER — Encounter: Payer: Self-pay | Admitting: Cardiology

## 2023-03-13 ENCOUNTER — Telehealth: Payer: Self-pay | Admitting: Cardiology

## 2023-03-13 NOTE — Telephone Encounter (Signed)
Office calling to try and get more information for the pt's clearance. Please advise

## 2023-03-14 ENCOUNTER — Encounter (HOSPITAL_COMMUNITY): Payer: 59

## 2023-03-14 NOTE — Telephone Encounter (Signed)
   Patient Name: Andrew Price  DOB: [birth year] MRN: [id number]  Primary Cardiologist: Rollene Rotunda, MD  Chart reviewed as part of pre-operative protocol coverage.   IF SIMPLE EXTRACTION/CLEANINGS: Simple dental extractions (i.e. 1-2 teeth) are considered low risk procedures per guidelines and generally do not require any specific cardiac clearance. It is also generally accepted that for simple extractions and dental cleanings, there is no need to interrupt blood thinner therapy.  If patient is having symptoms prior to surgery to contact our office to arrange for a follow-up visit,  SBE prophylaxis is not required for the patient from a cardiac standpoint.  I will route this recommendation to the requesting party via Epic fax function and remove from pre-op pool.  Please call with questions.  Joni Reining, NP 03/14/2023, 11:32 AM

## 2023-03-14 NOTE — Telephone Encounter (Signed)
I s/w Rosey Bath with dental office. I called to confirm procedure to be done at this time only, as we cannot provide a blanket type clearance.      Pre-operative Risk Assessment    Patient Name: Andrew Price  DOB: 1966-02-17 MRN: [id number]  DATE OF LAST VISIT:  03/12/23 DR. HOCRHEIN DATE OF NEXT VISIT: NONE    Request for Surgical Clearance    Procedure:   SIMPLE DENTAL CLEANING ONLY AT THIS TIME; FILLINGS IN THE FUTURE WILL NEED NEW REQUEST WHEN READY FOR FILLINGS.  Date of Surgery:  Clearance TBD                                 Surgeon:  DR. Guinevere Ferrari, DDS Surgeon's Group or Practice Name:  Guinevere Ferrari, DDS Phone number:  [phone] Fax number:  [phone]   Type of Clearance Requested:   - Medical  - Pharmacy:  Hold Aspirin and Clopidogrel (Plavix)   DR. PHELPS IS ALSO NEEDING TO KNOW IF PT WILL NEED SBE/ABX?    Type of Anesthesia:  None    Additional requests/questions:    Elpidio Anis   03/14/2023, 11:11 AM

## 2023-03-14 NOTE — Telephone Encounter (Signed)
I left message for the dental office that I do not see that a clearance request has been received . Please fax request today to fax# [phone] attn: preop team. Once we have request we will proceed to the preop APP for review.

## 2023-03-16 ENCOUNTER — Ambulatory Visit: Payer: 59 | Admitting: Cardiology

## 2023-03-16 ENCOUNTER — Encounter (HOSPITAL_COMMUNITY): Payer: 59

## 2023-03-19 ENCOUNTER — Encounter (HOSPITAL_COMMUNITY): Payer: 59

## 2023-03-21 ENCOUNTER — Encounter (HOSPITAL_COMMUNITY): Payer: 59

## 2023-03-23 ENCOUNTER — Encounter (HOSPITAL_COMMUNITY)
Admission: RE | Admit: 2023-03-23 | Discharge: 2023-03-23 | Disposition: A | Discharge status: Home / Self Care | Payer: 59 | Source: Ambulatory Visit | Attending: Cardiology | Admitting: Cardiology

## 2023-03-23 DIAGNOSIS — Z955 Presence of coronary angioplasty implant and graft: Secondary | ICD-10-CM | POA: Diagnosis not present

## 2023-03-26 ENCOUNTER — Encounter (HOSPITAL_COMMUNITY)
Admission: RE | Admit: 2023-03-26 | Discharge: 2023-03-26 | Disposition: A | Discharge status: Home / Self Care | Payer: 59 | Source: Ambulatory Visit | Attending: Cardiology | Admitting: Cardiology

## 2023-03-26 DIAGNOSIS — Z955 Presence of coronary angioplasty implant and graft: Secondary | ICD-10-CM

## 2023-03-30 ENCOUNTER — Encounter (HOSPITAL_COMMUNITY)
Admission: RE | Admit: 2023-03-30 | Discharge: 2023-03-30 | Disposition: A | Discharge status: Home / Self Care | Payer: 59 | Source: Ambulatory Visit | Attending: Cardiology | Admitting: Cardiology

## 2023-03-30 VITALS — Ht 70.0 in | Wt 251.3 lb

## 2023-03-30 DIAGNOSIS — Z955 Presence of coronary angioplasty implant and graft: Secondary | ICD-10-CM | POA: Diagnosis not present

## 2023-04-02 ENCOUNTER — Ambulatory Visit (HOSPITAL_COMMUNITY): Payer: 59

## 2023-10-02 ENCOUNTER — Other Ambulatory Visit: Payer: Self-pay | Admitting: Family Medicine

## 2023-10-02 DIAGNOSIS — Z8719 Personal history of other diseases of the digestive system: Secondary | ICD-10-CM

## 2023-10-02 DIAGNOSIS — Z800 Family history of malignant neoplasm of digestive organs: Secondary | ICD-10-CM

## 2023-10-04 ENCOUNTER — Telehealth: Payer: Self-pay | Admitting: Cardiology

## 2023-10-04 DIAGNOSIS — I10 Essential (primary) hypertension: Secondary | ICD-10-CM

## 2023-10-04 DIAGNOSIS — I2510 Atherosclerotic heart disease of native coronary artery without angina pectoris: Secondary | ICD-10-CM

## 2023-10-04 DIAGNOSIS — E669 Obesity, unspecified: Secondary | ICD-10-CM

## 2023-10-04 DIAGNOSIS — E785 Hyperlipidemia, unspecified: Secondary | ICD-10-CM

## 2023-10-04 MED ORDER — LISINOPRIL 20 MG PO TABS: 20.0000 mg | ORAL_TABLET | Freq: Every day | ORAL | 3 refills | Status: AC

## 2023-10-04 MED ORDER — LISINOPRIL 20 MG PO TABS
10.0000 mg | ORAL_TABLET | Freq: Every day | ORAL | 3 refills | Status: DC
Start: 2023-10-04 — End: 2023-10-04

## 2023-10-04 NOTE — Telephone Encounter (Signed)
 Called patient back with Dr. Denver advisement.   Andrew Agent, MD to Me   10/04/23  1:39 PM Increase lisinopril  to 20 mg daily and keep an eye on the blood pressure.   Patient agreed to plan.

## 2023-10-04 NOTE — Telephone Encounter (Signed)
 Called patient back about message. Patient stated he has had elevated BP ( 173/102, 165/101) for the last week. Patient stated he has some center chest pressure, but nothing like he felt last September before his heart cath. In September, patient had successful PCI of the proximal LAD with a drug-eluting stent. Patient complaining of dizziness and headache. Patient denies any SOB, diet changes, or new stressors. Patient is currently on lisinopril  10 mg by mouth daily. Will send message to Dr. Lavona for advisement.

## 2023-10-04 NOTE — Telephone Encounter (Signed)
 Pt c/o BP issue: STAT if pt c/o blurred vision, one-sided weakness or slurred speech.  STAT if BP is GREATER than 180/120 TODAY.  STAT if BP is LESS than 90/60 and SYMPTOMATIC TODAY  1. What is your BP concern? Elevated for the last week  2. Have you taken any BP medication today? Yes  3. What are your last 5 BP readings?173/102 - this morning, 165/101 - Last night  4. Are you having any other symptoms (ex. Dizziness, headache, blurred vision, passed out)? Dizziness, headache

## 2023-10-15 ENCOUNTER — Other Ambulatory Visit

## 2023-10-15 NOTE — Progress Notes (Unsigned)
 Cardiology Office Note   Date:  10/18/2023  ID:  Andrew Price, DOB June 22, 1965, MRN [id number] PCP: Frederik Charleston, MD  [facility] Providers Cardiologist:  Lynwood Schilling, MD Cardiology APP:  Madie Jon Garre, [place] {   History of Present Illness Andrew Price is a 58 y.o. male with a past medical history of CAD status post stenting of proximal LAD September 2024, shortness of breath, sleep apnea here for follow-up appointment.   He was seen in December 2024 and since then had done well.  Was having some muscle aches on Crestor  but was not severe.  No new shortness of breath, PND, orthopnea.  No reported palpitations, presyncope or syncope.  Exercising at rehab and doing an e-bike for cardiovascular endurance.  Also likes walking for exercise.  He was not having any previous symptoms such as chest pain or shortness of breath.  Today, he presents with hypertension and coronary artery disease with complaint of elevated blood pressure and chest tightness.  Blood pressure has been elevated over the past few weeks, with home readings averaging 158/85 mmHg and peaking at 173/102 mmHg. He takes 20 mg of lisinopril  in the morning, increased from 10 mg after a recent spike. Blood pressure today is 116/70 mmHg.  He has coronary artery disease with a stent placed in September of the previous year. Over the past month, he experiences intermittent chest tightness at rest and with activity, lasting 30 minutes to an hour, resolving without nitroglycerin . He recalls waking up with chest tightness and sweating earlier this week. No nitroglycerin  use for chest tightness.  He takes fish oil capsules at night. Recent lab work shows elevated triglycerides at 1477 mg/dL despite being on a generic form of Vascepa . A1c has increased from 6.1% in September to 9.2%. He is not on any medication for diabetes.  We have encouraged him to discuss this with his primary at his next visit.  No  SOB. No edema, orthopnea, PND. Reports no palpitations.   Discussed the use of AI scribe software for clinical note transcription with the patient, who gave verbal consent to proceed.   ROS: Pertinent ROS in HPI  Studies Reviewed     Cardiac catheterization 12/21/2022   Left Anterior Descending  The vessel exhibits minimal luminal irregularities.  Prox LAD lesion is 90% stenosed. Vessel is the culprit lesion.    Left Circumflex  The vessel exhibits minimal luminal irregularities.    Right Coronary Artery  The vessel exhibits minimal luminal irregularities.    Intervention   Prox LAD lesion  Stent  CATH LAUNCHER 6FR EBU3.5 guide catheter was inserted. Lesion crossed with guidewire using a WIRE RUNTHROUGH .[id number]. Pre-stent angioplasty was performed using a BALLN EMERGE MR 3.0X12. A drug-eluting stent was successfully placed using a SYNERGY XD 4.0X16. Stent strut is well apposed. Post-stent angioplasty was performed using a BALL SAPPHIRE NC24 4.5X8.  Post-Intervention Lesion Assessment  The intervention was successful. Pre-interventional TIMI flow is 3. Post-intervention TIMI flow is 3. No complications occurred at this lesion. Ultrasound (IVUS) was performed on the lesion post PCI using a CATH OPTICROSS HD. Stent well apposed.  There is a 0% residual stenosis post intervention.     Wall Motion              Left Heart  Left Ventricle The left ventricular size is normal. The left ventricular systolic function is normal. LV end diastolic pressure is normal. The left ventricular ejection fraction is 55-65% by visual estimate. No regional  wall motion abnormalities.  Aortic Valve There is no aortic valve stenosis.   Coronary Diagrams  Diagnostic Dominance: Right  Intervention  Intervention    Implants      Physical Exam VS:  BP 116/70   Pulse 64   Ht 5' 10 (1.778 m)   Wt 238 lb 3.2 oz (108 kg)   SpO2 97%   BMI 34.18 kg/m        Wt Readings from Last 3  Encounters:  10/18/23 238 lb 3.2 oz (108 kg)  03/30/23 251 lb 5.2 oz (114 kg)  03/12/23 253 lb (114.8 kg)    GEN: Well nourished, well developed in no acute distress NECK: No JVD; No carotid bruits CARDIAC: RRR, no murmurs, rubs, gallops RESPIRATORY:  Clear to auscultation without rales, wheezing or rhonchi  ABDOMEN: Soft, non-tender, non-distended EXTREMITIES:  No edema; No deformity   ASSESSMENT AND PLAN  Chest pain Intermittent chest tightness, concern for stent patency due to history of LAD stent placement. - Order treadmill stress test to evaluate EKG changes and assess stent patency. -triglycerides now 1477, refer to pharmD lipid clinic. Working on PA for Repatha  Hypertension Elevated home blood pressure readings, current office reading lower than usual. Lisinopril  dose recently increased. - Continue lisinopril  20 mg daily. - Monitor blood pressure at home, recording readings 1-2 hours after medication in the morning and 12 hours later in the evening. - Consider replacing blood pressure cuff if over two years old. - Send blood pressure readings via MyChart for review.  Hypertriglyceridemia Triglycerides at 1477 mg/dL, current treatment ineffective. Discussed injectable options for significant reduction. - Initiate prior authorization process for Repatha or Praluent. - Provide literature on Repatha and Praluent. - Schedule follow-up lipid panel in three months to assess triglyceride levels. -continue vascepa    Type 2 diabetes mellitus A1c increased to 9.2%, indicating poor glycemic control. No current diabetes medication. - Discuss potential initiation of diabetes medication such as Jardiance or Farxiga with primary care provider. - Monitor carbohydrate intake and blood sugar levels.   OSA - Compliant with CPAP and tolerating     Informed Consent   Shared Decision Making/Informed Consent{  The risks [chest pain, shortness of breath, cardiac arrhythmias, dizziness,  blood pressure fluctuations, myocardial infarction, stroke/transient ischemic attack, and life-threatening complications (estimated to be 1 in 10,000)], benefits (risk stratification, diagnosing coronary artery disease, treatment guidance) and alternatives of an exercise tolerance test were discussed in detail with Andrew Price and he agrees to proceed.     Dispo: Follow-up in three months, sooner based on stress test results.   Signed, Orren LOISE Fabry, PA-C

## 2023-10-18 ENCOUNTER — Ambulatory Visit: Payer: PRIVATE HEALTH INSURANCE | Attending: Physician Assistant | Admitting: Physician Assistant

## 2023-10-18 ENCOUNTER — Encounter: Payer: Self-pay | Admitting: Physician Assistant

## 2023-10-18 VITALS — BP 116/70 | HR 64 | Ht 70.0 in | Wt 238.2 lb

## 2023-10-18 DIAGNOSIS — I2510 Atherosclerotic heart disease of native coronary artery without angina pectoris: Secondary | ICD-10-CM | POA: Diagnosis not present

## 2023-10-18 DIAGNOSIS — E669 Obesity, unspecified: Secondary | ICD-10-CM

## 2023-10-18 DIAGNOSIS — G4730 Sleep apnea, unspecified: Secondary | ICD-10-CM | POA: Diagnosis not present

## 2023-10-18 DIAGNOSIS — E785 Hyperlipidemia, unspecified: Secondary | ICD-10-CM

## 2023-10-18 DIAGNOSIS — Z9861 Coronary angioplasty status: Secondary | ICD-10-CM

## 2023-10-18 NOTE — Patient Instructions (Addendum)
 Medication Instructions:  Your physician recommends that you continue on your current medications as directed. Please refer to the Current Medication list given to you today. *If you need a refill on your cardiac medications before your next appointment, please call your pharmacy*  Lab Work: 3 MONTHS FASTING-LIPIDS, LFT, BMET, MAG (01/18/2024) If you have labs (blood work) drawn today and your tests are completely normal, you will receive your results only by: MyChart Message (if you have MyChart) OR A paper copy in the mail If you have any lab test that is abnormal or we need to change your treatment, we will call you to review the results.  Testing/Procedures: Your physician has requested that you have an exercise tolerance test. For further information please visit [email or web address] Please also follow instruction sheet, as given.   Follow-Up: At [facility], you and your health needs are our priority.  As part of our continuing mission to provide you with exceptional heart care, our providers are all part of one team.  This team includes your primary Cardiologist (physician) and Advanced Practice Providers or APPs (Physician Assistants and Nurse Practitioners) who all work together to provide you with the care you need, when you need it.  Your next appointment:   3 month(s)  Provider:   Lynwood Schilling, MD    We recommend signing up for the patient portal called MyChart.  Sign up information is provided on this After Visit Summary.  MyChart is used to connect with patients for Virtual Visits (Telemedicine).  Patients are able to view lab/test results, encounter notes, upcoming appointments, etc.  Non-urgent messages can be sent to your provider as well.   To learn more about what you can do with MyChart, go to [email or web address].   Other Instructions You have been referred to PHARM-D (DISCUSS PCSK9)

## 2023-10-19 ENCOUNTER — Telehealth: Payer: Self-pay | Admitting: Pharmacist

## 2023-10-19 NOTE — Telephone Encounter (Signed)
 Received request for lipoid consultation. Triglycerides are remarkably elevated. Called to schedule appointment; left voice message to call back [phone]

## 2023-10-22 ENCOUNTER — Ambulatory Visit: Payer: PRIVATE HEALTH INSURANCE | Attending: Internal Medicine | Admitting: Pharmacist

## 2023-10-22 DIAGNOSIS — E781 Pure hyperglyceridemia: Secondary | ICD-10-CM

## 2023-10-22 MED ORDER — FENOFIBRATE 54 MG PO TABS: 54.0000 mg | ORAL_TABLET | Freq: Every day | ORAL | 3 refills | Status: AC

## 2023-10-22 NOTE — Patient Instructions (Addendum)
 Please start taking fenofibrate  54mg  daily Continue rosuvastatin  40mg  daily and Icosapent  Ethyl 2g twice a day  Labs in 1-1.5 months patient with  Please try to reduce your alcohol and saturated fat intake   Hyperlipidemia Foods high in saturated fat tend to increase LDL (bad) cholesterol the most.  Not all fat is bad fat! Foods higher in unsaturated fat are healthy, like fish, nuts, and avocadoes. Overall, following a diet like the Mediterranean diet can help to improve your cholesterol. Hypertriglyceridemia Foods high in carbohydrates and sugar, as well as alcohol, can increase your triglycerides. If you are diabetic, poorly controlled blood sugar can also increase your triglycerides. A non-fasting state can affect the triglyceride level in your lab work. Please make sure you are fasting to improve accuracy of this lab test.  Eat more of these Eat less of these  Carbohydrates Fiber-rich whole grains: oats, whole wheat pasta or bread, quinoa, barley, oats and brown rice Aim for  of your plate to be whole grains Men: aim for > 38 grams of fiber per day Women: aim for > 25 grams of fiber per day Refined grains: white bread, rice, or pasta, macaroni and cheese Foods with added sugar Processed foods: desserts like cake, cookies, donuts, muffins, and pastries; microwave meals, chips, [place] fries  Fruits and vegetables A variety of bright colored fruits and vegetables: spinach, broccoli, tomatoes, carrots, berries,  oranges, apples, bananas, berries, and melon Aim for  of your plate to be fruits/vegetables Canned vegetables Starchy vegetables like potatoes Canned fruit in heavy syrup  Protein Lean meat: skinless chicken or [place] Fish: salmon, trout, tuna, cod, tilapia, flounder, etc Legumes: beans, lentils, chickpeas, tofu, nuts Aim for  of your plate to be protein Red, fatty, or fried meat Processed foods: deli meat, hot dogs, burgers, pizza, fast food   Dairy, fats and oils  Unsaturated fats: fish, nuts, and avocadoes  Low fat or fat free milk or yogurt Olive and canola oil Saturated fats: butter, lard, cream, coconut oil Whole milk and other full fat dairy products like cheese Sugar-sweetened dairy products (many yogurts have added sugar)  Drinks Water: plain or sparkling Sugar free or diet drinks Unsweet tea or coffee Keep added sugar intake to  < 6 teaspoons (24 grams) Regular soda Fruit juice Alcohol  Other ways to adopt a healthy lifestyle:  Exercise:  Exercise: Aim for 150 min of moderate intensity exercise weekly for heart health, and weights twice weekly for bone health. Stay active - any steps are better than no steps!  Sleep: Aim for 7-9 hours of sleep nightly.  Weight: Know what a healthy weight is for you (roughly BMI <25) and aim to maintain this. Unfortunately, this is not the most accurate measure of healthy weight, but it is the simplest measurement to use. A more accurate measurement involves body scanning which measures lean muscle, fat tissue and bony density. We do not have this equipment at [facility].

## 2023-10-22 NOTE — Assessment & Plan Note (Signed)
 Assessment: Triglycerides significantly elevated at 1477 on rosuvastatin  40 mg daily and Vascepa  2 g twice daily.  Per patient labs were fasting With same labs A1c was increased to 9.2-primary care has not started any diabetes treatment waiting to recheck labs At first patient stated not much is changed in his diet since his last labs in October.  However, after discussion it sounds like he is eating more fast food and red meat.  His stress level is also higher and is traveling a lot for work He had pancreatitis in May 2024, but no triglyceride levels were checked at that time There is 1 FDA approved medication for severely elevated triglycerides (Tryngolza) but at this time is only approved for [facility], which I'm not fully convinced patient has May qualify for clinical trial -but will need to improve diabetes control first He previously was on fenofibrate , stopped due to jitteriness Patient willing to retry fenofibrate , will start at lower dose  Plan: Start fenofibrate  54 mg daily Continue rosuvastatin  40 mg daily and icosapent  ethyl 2 g twice daily Recheck lipids, LFT, APO B in about 1 to 1 1/2 months Stressed the importance of improving diet, decreased refined carbs, saturated fat, increase exercise

## 2023-10-22 NOTE — Progress Notes (Signed)
 Patient ID: Andrew Price                 DOB: 14-Jul-1965                    MRN: [id number]      HPI: JHOEL STIEG is a 58 y.o. male patient referred to lipid clinic by Orren Fabry, PA. PMH is significant for CAD s/p stent to the LAD in 2024, OSA, DM.  History of pancreatitis in May 2024 and stent to the LAD in September 2024.  He does have a history of elevated triglycerides, over 500 in 2022.  In October his triglycerides were 254.  Latest labs in June with triglycerides of 1477 and A1c increased to 9.2.  Patient presents today accompanied by his wife.  We discussed most recent labs of elevated triglycerides.  Patient was not the impression he was here to talk about Repatha.  Explained that Repatha has very minimal effect on LDL-C.  His LDL cholesterol although it is slightly falsely low was 33 on last check.  He needs targeted treatment for his triglycerides both medication and lifestyle.  He had an MRI of his abdomen done yesterday primary care wants to repeat A1c before starting treatment for diabetes.  Patient reports increase in stress level.  He was previously doing cardiac rehab but has done very minimal exercise recently.  His intake and fast food and red meat has also increased.  Has about 8-10 alcoholic beverages per week.   Hemoglobin A1c Reviewed date:10/01/2023 03:30:40 PM Interpretation: Performing Lab: Notes/Report: Testing Performed at: [facility], [address], [address], [place], [place] [address]  eAG 217      Hgb A1c 9.2 4.8-5.6 % Prediabetes: 5.7-6.4%  Diabetes: >/= 6.5%   Lipid Panel w/reflex Reviewed date:10/01/2023 03:30:12 PM Interpretation: Performing Lab: Notes/Report: Testing Performed at: [facility], [address], [address], [place], [place] [address]  Cholesterol 222 <200 mg/dL    CHOL/HDL 7.5 7.9-5.9 Ratio    HDLD 30 30-70 mg/dL Values below 40 mg/dL indicate increased risk factor  Triglyceride 1477 0-199 mg/dL Lipemic  NHDL  807 9-870 mg/dL Range dependent upon risk factors.    Current Medications: Rosuvastatin  40 mg daily, icosapent  ethyl 2 g twice a day Intolerances: fenofibrate  - jitteriness; gemfibrozil  - not effective  Risk Factors: CAD, diabetes LDL-C goal: Less than 70 ApoB goal: Less than 80 Triglyceride goal: Less than 150  Diet: breakfast: omletee w/ fruit, [place] bacon Lunch: more fast food recently- chicken tenders, burgers, meat and potatoes, BBq Dinner: Chicken, salmon, steak Drink: unsweet tea, black coffee  Exercise: not much at all  Family History: father: high cholesterol Mother: pancreatic cancer  Social History: no tobacco, 8-10 drinks/week   Labs: Lipid Panel     Component Value Date/Time   CHOL 104 02/01/2023 0815   TRIG 254 (H) 02/01/2023 0815   HDL 32 (L) 02/01/2023 0815   CHOLHDL 3.3 02/01/2023 0815   LDLCALC 33 02/01/2023 0815   LABVLDL 39 02/01/2023 0815    Past Medical History:  Diagnosis Date   Gilbert syndrome    Gout    Hypertension    Mild sleep apnea    Obesity (BMI 30-39.9)     Current Outpatient Medications on File Prior to Visit  Medication Sig Dispense Refill   aspirin  EC (ASPIRIN  EC ADULT LOW DOSE) 81 MG tablet Take 1 tablet (81 mg total) by mouth daily. Swallow whole. 90 tablet 3   clopidogrel  (PLAVIX )  75 MG tablet Take 1 tablet (75 mg total) by mouth daily with breakfast. 90 tablet 3   colchicine  0.6 MG tablet Take 0.6 mg by mouth daily as needed (gout).     icosapent  Ethyl (VASCEPA ) 1 g capsule Take 2 capsules (2 g total) by mouth 2 (two) times daily. 120 capsule 11   lisinopril  (ZESTRIL ) 20 MG tablet Take 1 tablet (20 mg total) by mouth daily. 90 tablet 3   loratadine  (CLARITIN ) 10 MG tablet Take 10 mg by mouth daily as needed for allergies.     Multiple Vitamins-Minerals (MULTIVITAMIN WITH MINERALS) tablet Take 1 tablet by mouth daily. Men 50+     neomycin -polymyxin-dexameth (MAXITROL) 0.1 % OINT Place 1 Application into both eyes once a week.  Eye lid (Patient not taking: Reported on 10/18/2023)     nitroGLYCERIN  (NITROSTAT ) 0.4 MG SL tablet Place 1 tablet (0.4 mg total) under the tongue every 5 (five) minutes as needed for chest pain. 25 tablet 2   rosuvastatin  (CRESTOR ) 40 MG tablet Take 1 tablet (40 mg total) by mouth daily. 90 tablet 3   No current facility-administered medications on file prior to visit.    Allergies  Allergen Reactions   Flexeril [Cyclobenzaprine]     Hyperactive   Sildenafil     Other Reaction(s): nasal congestion    Assessment/Plan:  1. Hyperlipidemia -  Hypertriglyceridemia Assessment: Triglycerides significantly elevated at 1477 on rosuvastatin  40 mg daily and Vascepa  2 g twice daily.  Per patient labs were fasting With same labs A1c was increased to 9.2-primary care has not started any diabetes treatment waiting to recheck labs At first patient stated not much is changed in his diet since his last labs in October.  However, after discussion it sounds like he is eating more fast food and red meat.  His stress level is also higher and is traveling a lot for work He had pancreatitis in May 2024, but no triglyceride levels were checked at that time There is 1 FDA approved medication for severely elevated triglycerides (Tryngolza) but at this time is only approved for [facility], which I'm not fully convinced patient has May qualify for clinical trial -but will need to improve diabetes control first He previously was on fenofibrate , stopped due to jitteriness Patient willing to retry fenofibrate , will start at lower dose  Plan: Start fenofibrate  54 mg daily Continue rosuvastatin  40 mg daily and icosapent  ethyl 2 g twice daily Recheck lipids, LFT, APO B in about 1 to 1 1/2 months Stressed the importance of improving diet, decreased refined carbs, saturated fat, increase exercise    Thank you,  Jeran Hiltz D Heavyn Yearsley, Pharm.JONETTA SARAN, CPP [facility] A Division of [facility] [address] [place], [place] [address]  Phone: [phone]; Fax: [phone]

## 2023-11-08 ENCOUNTER — Encounter (HOSPITAL_COMMUNITY): Payer: Self-pay | Admitting: *Deleted

## 2023-11-09 ENCOUNTER — Encounter (HOSPITAL_COMMUNITY): Payer: Self-pay | Admitting: *Deleted

## 2023-11-19 ENCOUNTER — Ambulatory Visit (HOSPITAL_COMMUNITY)
Admission: RE | Admit: 2023-11-19 | Discharge: 2023-11-19 | Disposition: A | Discharge status: Home / Self Care | Payer: PRIVATE HEALTH INSURANCE | Source: Ambulatory Visit | Attending: Physician Assistant | Admitting: Physician Assistant

## 2023-11-19 DIAGNOSIS — Z9861 Coronary angioplasty status: Secondary | ICD-10-CM | POA: Diagnosis not present

## 2023-11-19 DIAGNOSIS — I2510 Atherosclerotic heart disease of native coronary artery without angina pectoris: Secondary | ICD-10-CM | POA: Diagnosis present

## 2023-11-19 DIAGNOSIS — E669 Obesity, unspecified: Secondary | ICD-10-CM | POA: Diagnosis present

## 2023-11-19 DIAGNOSIS — G4730 Sleep apnea, unspecified: Secondary | ICD-10-CM | POA: Diagnosis present

## 2023-11-19 LAB — EXERCISE TOLERANCE TEST
Angina Index: 1
MPHR: 162 {beats}/min
Peak HR: 141 {beats}/min
Percent HR: 87 %
Rest HR: 72 {beats}/min
ST Depression (mm): 1.5 mm

## 2023-11-21 ENCOUNTER — Ambulatory Visit: Payer: Self-pay | Admitting: Cardiology

## 2023-11-21 DIAGNOSIS — R0602 Shortness of breath: Secondary | ICD-10-CM

## 2023-11-21 DIAGNOSIS — I2510 Atherosclerotic heart disease of native coronary artery without angina pectoris: Secondary | ICD-10-CM

## 2023-11-23 ENCOUNTER — Encounter: Payer: Self-pay | Admitting: Physician Assistant

## 2023-11-26 NOTE — Telephone Encounter (Signed)
 Orders have been placed. See MyChart messages.

## 2023-11-30 ENCOUNTER — Ambulatory Visit: Payer: PRIVATE HEALTH INSURANCE | Admitting: Pharmacist

## 2023-12-01 ENCOUNTER — Encounter (HOSPITAL_COMMUNITY): Payer: Self-pay

## 2023-12-03 ENCOUNTER — Ambulatory Visit
Admission: RE | Admit: 2023-12-03 | Discharge: 2023-12-03 | Disposition: A | Discharge status: Home / Self Care | Payer: PRIVATE HEALTH INSURANCE | Attending: Family Medicine | Admitting: Family Medicine

## 2023-12-03 VITALS — BP 149/89 | HR 74 | Temp 98.2°F | Resp 19

## 2023-12-03 DIAGNOSIS — K122 Cellulitis and abscess of mouth: Secondary | ICD-10-CM

## 2023-12-03 DIAGNOSIS — J029 Acute pharyngitis, unspecified: Secondary | ICD-10-CM

## 2023-12-03 MED ORDER — AMOXICILLIN-POT CLAVULANATE 875-125 MG PO TABS: 1.0000 | ORAL_TABLET | Freq: Two times a day (BID) | ORAL | 0 refills | Status: DC

## 2023-12-03 NOTE — ED Provider Notes (Signed)
 [facility]    CSN: [id number] Arrival date & time: 12/03/23  1137      History   Chief Complaint Chief Complaint  Patient presents with   Sore Throat    Entered by patient   head cold    HPI Andrew Price is a 58 y.o. male.   HPI 58 year old male presents with sore throat for days.  PMH significant for morbid obesity, CAD (s/p PTCA) HTN, and Gilbert syndrome.  Patient is currently on Plavix  and denies any unusual bleeding.  Patient reports his upcoming CT PET study come up this week and wants to be sure it is safe to do so.  Past Medical History:  Diagnosis Date   Bertrum syndrome    Gout    Hypertension    Mild sleep apnea    Obesity (BMI 30-39.9)     Patient Active Problem List   Diagnosis Date Noted   Hypertriglyceridemia 02/12/2023   Chest pain 12/21/2022   SOB (shortness of breath) 12/07/2022   Acute pancreatitis 08/05/2022   Hypertension    Mild sleep apnea    Obesity (BMI 30-39.9)     Past Surgical History:  Procedure Laterality Date   CHOLECYSTECTOMY     CORONARY STENT INTERVENTION N/A 12/21/2022   Procedure: CORONARY STENT INTERVENTION;  Surgeon: Dann Candyce RAMAN, MD;  Location: [facility] INVASIVE CV LAB;  Service: Cardiovascular;  Laterality: N/A;   CORONARY ULTRASOUND/IVUS N/A 12/21/2022   Procedure: Coronary Ultrasound/IVUS;  Surgeon: Dann Candyce RAMAN, MD;  Location: [facility] INVASIVE CV LAB;  Service: Cardiovascular;  Laterality: N/A;   HERNIA REPAIR     LEFT HEART CATH AND CORONARY ANGIOGRAPHY N/A 12/21/2022   Procedure: LEFT HEART CATH AND CORONARY ANGIOGRAPHY;  Surgeon: Dann Candyce RAMAN, MD;  Location: [facility] INVASIVE CV LAB;  Service: Cardiovascular;  Laterality: N/A;       Home Medications    Prior to Admission medications   Medication Sig Start Date End Date Taking? Authorizing Provider  amoxicillin -clavulanate (AUGMENTIN ) 875-125 MG tablet Take 1 tablet by mouth every 12 (twelve) hours. 12/03/23  Yes Teddy Sharper, FNP   aspirin  EC (ASPIRIN  EC ADULT LOW DOSE) 81 MG tablet Take 1 tablet (81 mg total) by mouth daily. Swallow whole. 01/01/23   [facility], Jon Garre, PA  clopidogrel  (PLAVIX ) 75 MG tablet Take 1 tablet (75 mg total) by mouth daily with breakfast. 01/01/23   [facility], Jon Garre, PA  colchicine  0.6 MG tablet Take 0.6 mg by mouth daily as needed (gout).    [provider]  fenofibrate  54 MG tablet Take 1 tablet (54 mg total) by mouth daily. 10/22/23   Maccia, Melissa D, RPH-CPP  icosapent  Ethyl (VASCEPA ) 1 g capsule Take 2 capsules (2 g total) by mouth 2 (two) times daily. 01/01/23   [facility], Jon Garre, PA  lisinopril  (ZESTRIL ) 20 MG tablet Take 1 tablet (20 mg total) by mouth daily. 10/04/23   Lavona Agent, MD  loratadine  (CLARITIN ) 10 MG tablet Take 10 mg by mouth daily as needed for allergies.    [provider]  Multiple Vitamins-Minerals (MULTIVITAMIN WITH MINERALS) tablet Take 1 tablet by mouth daily. Men 50+    [provider]  neomycin -polymyxin-dexameth (MAXITROL) 0.1 % OINT Place 1 Application into both eyes once a week. Eye lid Patient not taking: Reported on 10/18/2023    [provider]  nitroGLYCERIN  (NITROSTAT ) 0.4 MG SL tablet Place 1 tablet (0.4 mg total) under the tongue every 5 (five) minutes as needed for chest pain.  12/21/22   Henry Manuelita NOVAK, NP  rosuvastatin  (CRESTOR ) 40 MG tablet Take 1 tablet (40 mg total) by mouth daily. 01/01/23 01/01/24  DukeJon Garre, PA    Family History Family History  Problem Relation Age of Onset   Breast cancer Mother    Ovarian cancer Mother    Hypertension Father    Pulmonary fibrosis Father    Hyperlipidemia Brother     Social History Social History   Tobacco Use   Smoking status: Never  Substance Use Topics   Alcohol use: Yes    Comment: social   Drug use: No     Allergies   Flexeril [cyclobenzaprine] and Sildenafil   Review of Systems Review of Systems  HENT:  Positive for congestion and  sore throat.      Physical Exam Triage Vital Signs ED Triage Vitals  Encounter Vitals Group     BP      Girls Systolic BP Percentile      Girls Diastolic BP Percentile      Boys Systolic BP Percentile      Boys Diastolic BP Percentile      Pulse      Resp      Temp      Temp src      SpO2      Weight      Height      Head Circumference      Peak Flow      Pain Score      Pain Loc      Pain Education      Exclude from Growth Chart    No data found.  Updated Vital Signs BP (!) 149/89   Pulse 74   Temp 98.2 F (36.8 C)   Resp 19   SpO2 98%   Visual Acuity Right Eye Distance:   Left Eye Distance:   Bilateral Distance:    Right Eye Near:   Left Eye Near:    Bilateral Near:     Physical Exam Vitals and nursing note reviewed.  Constitutional:      Appearance: Normal appearance. He is well-developed. He is obese. He is ill-appearing.  HENT:     Head: Normocephalic and atraumatic.     Right Ear: Tympanic membrane, ear canal and external ear normal.     Left Ear: Tympanic membrane, ear canal and external ear normal.     Mouth/Throat:     Mouth: Mucous membranes are moist.     Pharynx: Oropharynx is clear. Uvula midline. Posterior oropharyngeal erythema and uvula swelling present.     Tonsils: 0 on the right. 0 on the left.     Comments: Moderate amount of clear drainage of posterior oropharynx noted Eyes:     Extraocular Movements: Extraocular movements intact.     Conjunctiva/sclera: Conjunctivae normal.     Pupils: Pupils are equal, round, and reactive to light.  Cardiovascular:     Rate and Rhythm: Normal rate and regular rhythm.     Heart sounds: Normal heart sounds. No murmur heard.    No friction rub.  Pulmonary:     Effort: Pulmonary effort is normal.     Breath sounds: Normal breath sounds. No wheezing, rhonchi or rales.     Comments: Infrequent nonproductive cough on exam Musculoskeletal:        General: Normal range of motion.  Skin:     General: Skin is warm and dry.  Neurological:     General: No focal  deficit present.     Mental Status: He is alert and oriented to person, place, and time.  Psychiatric:        Mood and Affect: Mood normal.        Behavior: Behavior normal.      UC Treatments / Results  Labs (all labs ordered are listed, but only abnormal results are displayed) Labs Reviewed - No data to display  EKG   Radiology No results found.  Procedures Procedures (including critical care time)  Medications Ordered in UC Medications - No data to display  Initial Impression / Assessment and Plan / UC Course  I have reviewed the triage vital signs and the nursing notes.  Pertinent labs & imaging results that were available during my care of the patient were reviewed by me and considered in my medical decision making (see chart for details).     MDM: 1.  Uvulitis-Rx'd Augmentin  875/125 mg tablet: Take 1 tablet twice daily x 7 days; 2.  Pharyngitis, unspecified etiology-Rx Augmentin  875/125 mg tablet: Take 1 tablet twice daily x 7 days. Advised patient to take medication as directed with food to completion.  Encouraged to increase daily water intake to 64 ounces per day while taking this medication.  Advised if symptoms worsen and/or unresolved please follow-up with your PCP or here for further evaluation.  Patient discharged home, hemodynamically stable. Final Clinical Impressions(s) / UC Diagnoses   Final diagnoses:  Pharyngitis, unspecified etiology  Uvulitis     Discharge Instructions      Advised patient to take medication as directed with food to completion.  Encouraged to increase daily water intake to 64 ounces per day while taking this medication.  Advised if symptoms worsen and/or unresolved please follow-up with your PCP or here for further evaluation.     ED Prescriptions     Medication Sig Dispense Auth. Provider   amoxicillin -clavulanate (AUGMENTIN ) 875-125 MG tablet Take 1  tablet by mouth every 12 (twelve) hours. 14 tablet Locklan Canoy, FNP      PDMP not reviewed this encounter.   Teddy Sharper, FNP 12/03/23 1252

## 2023-12-03 NOTE — Discharge Instructions (Addendum)
 Advised patient to take medication as directed with food to completion.  Encouraged to increase daily water intake to 64 ounces per day while taking this medication.  Advised if symptoms worsen and/or unresolved please follow-up with your PCP or here for further evaluation.

## 2023-12-03 NOTE — ED Triage Notes (Addendum)
 Pt presents to [facility] with head cold and sore throat for one week.  Pt reports sob at night. Pt reports at home Covid neg on Thursday. Using otc tylenol  cold and flu and robitussin.  Pt reports he has an upcoming CTPET study coming up this week and wants to make sure he is safe to do so.

## 2023-12-05 ENCOUNTER — Encounter (HOSPITAL_COMMUNITY)
Admission: RE | Admit: 2023-12-05 | Discharge: 2023-12-05 | Disposition: A | Discharge status: Home / Self Care | Payer: PRIVATE HEALTH INSURANCE | Source: Ambulatory Visit | Attending: Physician Assistant | Admitting: Physician Assistant

## 2023-12-05 DIAGNOSIS — Z9861 Coronary angioplasty status: Secondary | ICD-10-CM | POA: Diagnosis present

## 2023-12-05 DIAGNOSIS — I2510 Atherosclerotic heart disease of native coronary artery without angina pectoris: Secondary | ICD-10-CM | POA: Diagnosis present

## 2023-12-05 DIAGNOSIS — R0602 Shortness of breath: Secondary | ICD-10-CM | POA: Insufficient documentation

## 2023-12-05 LAB — NM PET CT CARDIAC PERFUSION MULTI W/ABSOLUTE BLOODFLOW
LV dias vol: 118 mL (ref 62–150)
LV sys vol: 59 mL (ref 4.2–5.8)
MBFR: 1.92
Nuc Rest EF: 50 %
Nuc Stress EF: 57 %
Peak HR: 77 {beats}/min
Rest HR: 66 {beats}/min
Rest MBF: 0.79 ml/g/min
Rest Nuclear Isotope Dose: 28.1 mCi
ST Depression (mm): 0 mm
Stress MBF: 1.52 ml/g/min
Stress Nuclear Isotope Dose: 28.1 mCi

## 2023-12-05 MED ORDER — REGADENOSON 0.4 MG/5ML IV SOLN
0.4000 mg | Freq: Once | INTRAVENOUS | Status: AC
  Administered 2023-12-05: 0.4 mg via INTRAVENOUS

## 2023-12-05 MED ORDER — RUBIDIUM RB82 GENERATOR (RUBYFILL)
28.1400 | PACK | Freq: Once | INTRAVENOUS | Status: AC
  Administered 2023-12-05: 28.14 via INTRAVENOUS

## 2023-12-05 MED ORDER — REGADENOSON 0.4 MG/5ML IV SOLN
INTRAVENOUS | Status: AC
  Filled 2023-12-05: qty 5

## 2023-12-07 ENCOUNTER — Ambulatory Visit: Payer: Self-pay | Admitting: Physician Assistant

## 2023-12-26 ENCOUNTER — Other Ambulatory Visit: Payer: Self-pay | Admitting: Physician Assistant

## 2023-12-26 ENCOUNTER — Other Ambulatory Visit: Payer: Self-pay

## 2023-12-26 DIAGNOSIS — I2510 Atherosclerotic heart disease of native coronary artery without angina pectoris: Secondary | ICD-10-CM

## 2023-12-26 DIAGNOSIS — I10 Essential (primary) hypertension: Secondary | ICD-10-CM

## 2023-12-26 DIAGNOSIS — E669 Obesity, unspecified: Secondary | ICD-10-CM

## 2023-12-26 DIAGNOSIS — E785 Hyperlipidemia, unspecified: Secondary | ICD-10-CM

## 2023-12-26 MED ORDER — CLOPIDOGREL BISULFATE 75 MG PO TABS: 75.0000 mg | ORAL_TABLET | Freq: Every day | ORAL | 2 refills | Status: AC

## 2023-12-26 MED ORDER — ROSUVASTATIN CALCIUM 40 MG PO TABS: 40.0000 mg | ORAL_TABLET | Freq: Every day | ORAL | 2 refills | Status: AC

## 2023-12-27 ENCOUNTER — Encounter: Payer: Self-pay | Admitting: Hematology and Oncology

## 2023-12-27 ENCOUNTER — Inpatient Hospital Stay: Payer: PRIVATE HEALTH INSURANCE | Attending: Hematology and Oncology | Admitting: Hematology and Oncology

## 2023-12-27 ENCOUNTER — Inpatient Hospital Stay: Payer: PRIVATE HEALTH INSURANCE

## 2023-12-27 VITALS — BP 126/76 | HR 68 | Temp 98.6°F | Resp 18 | Ht 70.0 in | Wt 236.8 lb

## 2023-12-27 DIAGNOSIS — E119 Type 2 diabetes mellitus without complications: Secondary | ICD-10-CM | POA: Insufficient documentation

## 2023-12-27 DIAGNOSIS — D696 Thrombocytopenia, unspecified: Secondary | ICD-10-CM | POA: Insufficient documentation

## 2023-12-27 DIAGNOSIS — E66811 Obesity, class 1: Secondary | ICD-10-CM | POA: Insufficient documentation

## 2023-12-27 DIAGNOSIS — E781 Pure hyperglyceridemia: Secondary | ICD-10-CM | POA: Insufficient documentation

## 2023-12-27 MED ORDER — METFORMIN HCL 500 MG PO TABS: 500.0000 mg | ORAL_TABLET | Freq: Two times a day (BID) | ORAL | 2 refills | Status: DC

## 2023-12-27 NOTE — Assessment & Plan Note (Signed)
 The patient has full-blown diabetes with high hemoglobin A1c I have extensive discussion with the patient the importance of dietary modification, exercise and medical management Given his cardiovascular risk factors, I recommend trial of metformin  and he agrees to try

## 2023-12-27 NOTE — Assessment & Plan Note (Signed)
 Multifactorial, likely due to excessive alcohol intake and uncontrolled diabetes We discussed importance of alcohol cessation and exercise

## 2023-12-27 NOTE — Progress Notes (Signed)
 [facility] CONSULT NOTE  Patient Care Team: Frederik Charleston, MD as PCP - General (Family Medicine) Lavona Agent, MD as PCP - Cardiology (Cardiology) Duke, Jon Garre, PA as Physician Assistant (Cardiology)  ASSESSMENT & PLAN Thrombocytopenia He is noted to have chronic intermittent thrombocytopenia since 2012 He had prior imaging study which shows signs of a fatty liver disease This is a most likely cause of his thrombocytopenia He does not need further workup  Type 2 diabetes mellitus (HCC) The patient has full-blown diabetes with high hemoglobin A1c I have extensive discussion with the patient the importance of dietary modification, exercise and medical management Given his cardiovascular risk factors, I recommend trial of metformin  and he agrees to try  Hypertriglyceridemia Multifactorial, likely due to excessive alcohol intake and uncontrolled diabetes We discussed importance of alcohol cessation and exercise  Orders Placed This Encounter  Procedures   CBC with Differential (Cancer Center Only)    Standing Status:   Future    Expiration Date:   12/26/2024   CMP (Cancer Center only)    Standing Status:   Future    Expiration Date:   12/26/2024   Vitamin B12    Standing Status:   Future    Expiration Date:   12/26/2024   All questions were answered. The patient knows to call the clinic with any problems, questions or concerns. No barriers to learning was detected. The total time spent in the appointment was 55 minutes encounter with patients including review of chart and various tests results, discussions about plan of care and coordination of care plan  Almarie Bedford, MD 12/27/2023 2:25 PM  CHIEF COMPLAINTS/PURPOSE OF CONSULTATION:  Chronic thrombocytopenia  HISTORY OF PRESENTING ILLNESS:  Andrew Price 58 y.o. male is here because of thrombocytopenia.  He was found to have abnormal CBC from routine labs I have the opportunity to review his  CBC dated back to 2012 On September 22, 2010, his platelet count was 136 Between 20 12-20 25, he has intermittent low platelet count between 120 to normal With his recent labs with his primary care doctor from July 2025, his hemoglobin A1c was 9%, fasting sugar of 269, platelet of 135, chronic elevated total bilirubin but normal liver enzymes  He denies recent bruising/bleeding, such as spontaneous epistaxis, hematuria, melena or hematochezia The patient has of 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 He denies prior blood or platelet transfusions He has significant alcohol use, 6-8 beers per week and bourbon 3-4 a week He has not been started on medication for diabetes He does not exercise in general  MEDICAL HISTORY:  Past Medical History:  Diagnosis Date   Bertrum syndrome    Gout    Hypertension    Mild sleep apnea    Obesity (BMI 30-39.9)     SURGICAL HISTORY: Past Surgical History:  Procedure Laterality Date   CHOLECYSTECTOMY     CORONARY STENT INTERVENTION N/A 12/21/2022   Procedure: CORONARY STENT INTERVENTION;  Surgeon: Dann Candyce RAMAN, MD;  Location: [facility] INVASIVE CV LAB;  Service: Cardiovascular;  Laterality: N/A;   CORONARY ULTRASOUND/IVUS N/A 12/21/2022   Procedure: Coronary Ultrasound/IVUS;  Surgeon: Dann Candyce RAMAN, MD;  Location: [facility] INVASIVE CV LAB;  Service: Cardiovascular;  Laterality: N/A;   HERNIA REPAIR     LEFT HEART CATH AND CORONARY ANGIOGRAPHY N/A 12/21/2022   Procedure: LEFT HEART CATH AND CORONARY ANGIOGRAPHY;  Surgeon: Dann Candyce RAMAN, MD;  Location: [facility] INVASIVE CV LAB;  Service: Cardiovascular;  Laterality: N/A;    SOCIAL HISTORY: Social History   Socioeconomic History   Marital status: Married    Spouse name: Not on file   Number of children: Not on file   Years of education: Not on file   Highest education level: Not on file  Occupational History   Not on file   Tobacco Use   Smoking status: Never   Smokeless tobacco: Not on file  Substance and Sexual Activity   Alcohol use: Yes    Comment: social   Drug use: No   Sexual activity: Not on file  Other Topics Concern   Not on file  Social History Narrative   Lives with wife.     Social Drivers of Corporate investment banker Strain: Not on file  Food Insecurity: No Food Insecurity (12/27/2023)   Hunger Vital Sign    Worried About Running Out of Food in the Last Year: Never true    Ran Out of Food in the Last Year: Never true  Transportation Needs: No Transportation Needs (12/27/2023)   PRAPARE - Administrator, Civil Service (Medical): No    Lack of Transportation (Non-Medical): No  Physical Activity: Not on file  Stress: Not on file  Social Connections: Not on file  Intimate Partner Violence: Not At Risk (12/27/2023)   Humiliation, Afraid, Rape, and Kick questionnaire    Fear of Current or Ex-Partner: No    Emotionally Abused: No    Physically Abused: No    Sexually Abused: No    FAMILY HISTORY: Family History  Problem Relation Age of Onset   Breast cancer Mother    Ovarian cancer Mother    Hypertension Father    Pulmonary fibrosis Father    Hyperlipidemia Brother     ALLERGIES:  is allergic to flexeril [cyclobenzaprine] and sildenafil.  MEDICATIONS:  Current Outpatient Medications  Medication Sig Dispense Refill   metFORMIN  (GLUCOPHAGE ) 500 MG tablet Take 1 tablet (500 mg total) by mouth 2 (two) times daily with a meal. 60 tablet 2   aspirin  EC (ASPIRIN  EC ADULT LOW DOSE) 81 MG tablet Take 1 tablet (81 mg total) by mouth daily. Swallow whole. 90 tablet 3   clopidogrel  (PLAVIX ) 75 MG tablet Take 1 tablet (75 mg total) by mouth daily with breakfast. 90 tablet 2   colchicine  0.6 MG tablet Take 0.6 mg by mouth daily as needed (gout).     fenofibrate  54 MG tablet Take 1 tablet (54 mg total) by mouth daily. 90 tablet 3   icosapent  Ethyl (VASCEPA ) 1 g capsule TAKE 2  CAPSULES BY MOUTH 2 TIMES DAILY. 360 capsule 3   lisinopril  (ZESTRIL ) 20 MG tablet Take 1 tablet (20 mg total) by mouth daily. 90 tablet 3   loratadine  (CLARITIN ) 10 MG tablet Take 10 mg by mouth daily as needed for allergies.     Multiple Vitamins-Minerals (MULTIVITAMIN WITH MINERALS) tablet Take 1 tablet by mouth daily. Men 50+     nitroGLYCERIN  (NITROSTAT ) 0.4 MG SL tablet Place 1 tablet (0.4 mg total) under the tongue every 5 (five) minutes as needed for chest pain. 25 tablet 2   rosuvastatin  (CRESTOR ) 40 MG tablet Take 1 tablet (40 mg total) by mouth daily. 90 tablet 2   No current facility-administered medications for this visit.    REVIEW OF SYSTEMS:   Constitutional: Denies fevers, chills or abnormal night sweats Eyes: Denies blurriness of vision, double vision or watery eyes Ears, nose, mouth, throat,  and face: Denies mucositis or sore throat Respiratory: Denies cough, dyspnea or wheezes Cardiovascular: Denies palpitation, chest discomfort or lower extremity swelling Gastrointestinal:  Denies nausea, heartburn or change in bowel habits Skin: Denies abnormal skin rashes Lymphatics: Denies new lymphadenopathy or easy bruising Neurological:Denies numbness, tingling or new weaknesses Behavioral/Psych: Mood is stable, no new changes  All other systems were reviewed with the patient and are negative.  PHYSICAL EXAMINATION: ECOG PERFORMANCE STATUS: 0 - Asymptomatic  Vitals:   12/27/23 1307  BP: 126/76  Pulse: 68  Resp: 18  Temp: 98.6 F (37 C)  SpO2: 96%   Filed Weights   12/27/23 1307  Weight: 236 lb 12.8 oz (107.4 kg)    GENERAL:alert, no distress and comfortable PSYCH: alert & oriented x 3 with fluent speech NEURO: no focal motor/sensory deficits  LABORATORY DATA:  I have reviewed the data as listed Lab Results  Component Value Date   WBC 6.4 12/20/2022   HGB 16.6 12/20/2022   HCT 48.3 12/20/2022   MCV 93 12/20/2022   PLT 161 12/20/2022     RADIOGRAPHIC  STUDIES: I have reviewed prior MRI of the abdomen from 2024 I have personally reviewed the radiological images as listed and agreed with the findings in the report. NM PET CT CARDIAC PERFUSION MULTI W/ABSOLUTE BLOODFLOW Result Date: 12/05/2023   The study is normal. The study is low risk.   LV perfusion is normal. There is no evidence of ischemia.   Rest left ventricular function is normal. Rest EF: 50%. Stress left ventricular function is normal. Stress EF: 57%. End diastolic cavity size is normal. End systolic cavity size is normal.   Myocardial blood flow was computed to be 0.63ml/g/min at rest and 1.52ml/g/min at stress. Global myocardial blood flow reserve was 1.92 and was normal.   Coronary calcium  assessment not performed due to prior revascularization.   Electronically signed by Jerel Balding, MD CLINICAL DATA:  This over-read does not include interpretation of cardiac or coronary anatomy or pathology. No interpretation the PET data set. The cardiac PET-CT interpretation by the cardiologist is attached. COMPARISON:  None Available. FINDINGS: Limited view of the lung parenchyma demonstrates no suspicious nodularity. Airways are normal. Limited view of the mediastinum demonstrates no adenopathy. Esophagus normal. Limited view of the upper abdomen unremarkable. Limited view of the skeleton and chest wall is unremarkable. IMPRESSION: No significant extracardiac findings. Electronically Signed   By: Jackquline Boxer M.D.   On: 12/05/2023 14:56

## 2023-12-27 NOTE — Assessment & Plan Note (Addendum)
 He is noted to have chronic intermittent thrombocytopenia since 2012 He had prior imaging study which shows signs of a fatty liver disease This is a most likely cause of his thrombocytopenia He does not need further workup

## 2024-01-01 ENCOUNTER — Encounter: Payer: Self-pay | Admitting: Hematology and Oncology

## 2024-01-03 ENCOUNTER — Inpatient Hospital Stay
Admission: RE | Admit: 2024-01-03 | Discharge: 2024-01-03 | Disposition: A | Discharge status: Home / Self Care | Payer: Self-pay | Source: Ambulatory Visit | Attending: Hematology and Oncology | Admitting: Hematology and Oncology

## 2024-01-03 ENCOUNTER — Other Ambulatory Visit (HOSPITAL_COMMUNITY): Payer: Self-pay | Admitting: Hematology and Oncology

## 2024-01-03 DIAGNOSIS — Z800 Family history of malignant neoplasm of digestive organs: Secondary | ICD-10-CM

## 2024-01-04 ENCOUNTER — Other Ambulatory Visit: Payer: Self-pay | Admitting: Hematology and Oncology

## 2024-01-04 ENCOUNTER — Encounter: Payer: Self-pay | Admitting: Hematology and Oncology

## 2024-01-13 DIAGNOSIS — I2510 Atherosclerotic heart disease of native coronary artery without angina pectoris: Secondary | ICD-10-CM | POA: Insufficient documentation

## 2024-01-13 NOTE — Progress Notes (Unsigned)
 Cardiology Office Note:   Date:  01/14/2024  ID:  Andrew Price, DOB 14-Jan-1966, MRN [id number] PCP: Frederik Charleston, MD  [facility] Providers Cardiologist:  Lynwood Schilling, MD Cardiology APP:  Madie Jon Garre, [place] {  History of Present Illness:   Andrew Price is a 58 y.o. male who presents for evaluation of CAD.  He had new onset SOB and was found to have proximal LAD stenosis and is status post stenting in Sept 2024.      She was having chest pain and saw Charmian Fabry in July.  He was having some chest discomfort which he describes today.  This is left upper chest.  It is not the jaw and neck pain he had with his previous angina.  It is sporadic.  It seems to happen with the exercises but not every time.  It can be 6 out of 10.  Stops when he stops what he is doing.  He can get over the emotional stress and rarely at rest.  It has been a stable pattern.  There is no associated nausea vomiting or diaphoresis.  He is not taking any nitroglycerin .  He gets a little dizzy at x 2.  She sent him for a stress perfusion study which was negative for any evidence of ischemia.  He has since had no increase or change in his symptoms.  He is also being evaluated for GI source.  He is being evaluated for low platelets.   ROS:   ED. review of systems otherwise as stated in the HPI negative for all other systems  Studies Reviewed:    EKG:     Sinus rhythm, rate 64, axis within normal limits, intervals within normal limits, no acute ST-T wave changes, October 18, 2023    Risk Assessment/Calculations:             Physical Exam:   VS:  BP 96/60   Pulse (!) 54   Ht 5' 10 (1.778 m)   Wt 236 lb (107 kg)   SpO2 94%   BMI 33.86 kg/m    Wt Readings from Last 3 Encounters:  01/14/24 236 lb (107 kg)  12/27/23 236 lb 12.8 oz (107.4 kg)  10/18/23 238 lb 3.2 oz (108 kg)     GEN: Well nourished, well developed in no acute distress NECK: No JVD; No carotid  bruits CARDIAC: RRR, no murmurs, rubs, gallops RESPIRATORY:  Clear to auscultation without rales, wheezing or rhonchi  ABDOMEN: Soft, non-tender, non-distended EXTREMITIES:  No edema; No deformity   ASSESSMENT AND PLAN:       CAD:   We had a long discussion and his chest discomfort is not similar to his previous angina.  It is not progressive since he talk to us  in July.  He had a negative perfusion study.  I would suspect the perfusion study would have high sensitivity if this was new ischemia particularly in the LAD.  The pain has nonanginal greater than anginal features.  He will let me know if he has any progressive symptoms but otherwise will go back to his primary provider to evaluate probable nonanginal causes while he has aggressive risk reduction.    SLEEP APNEA:   He is using his CPAP.    RISK REDUCTION:    He is going to have lipid profile.  LDL goal is 55.  HTN: Blood pressure is running a bit low today.  He did have his meds uptitrated.  Typically his blood pressures  in the 120s systolic.  No change in therapy unless he continues to have lower blood pressures.  Follow up with Orren Fabry in 3 months.   Signed, Lynwood Schilling, MD

## 2024-01-14 ENCOUNTER — Encounter: Payer: Self-pay | Admitting: Cardiology

## 2024-01-14 ENCOUNTER — Ambulatory Visit: Payer: PRIVATE HEALTH INSURANCE | Attending: Cardiology | Admitting: Cardiology

## 2024-01-14 VITALS — BP 96/60 | HR 54 | Ht 70.0 in | Wt 236.0 lb

## 2024-01-14 DIAGNOSIS — I2510 Atherosclerotic heart disease of native coronary artery without angina pectoris: Secondary | ICD-10-CM | POA: Diagnosis not present

## 2024-01-14 DIAGNOSIS — G4730 Sleep apnea, unspecified: Secondary | ICD-10-CM

## 2024-01-14 NOTE — Patient Instructions (Signed)
 Medication Instructions:  STOP Aspirin  *If you need a refill on your cardiac medications before your next appointment, please call your pharmacy*  Lab Work: Today at [facility] If you have labs (blood work) drawn today and your tests are completely normal, you will receive your results only by: MyChart Message (if you have MyChart) OR A paper copy in the mail If you have any lab test that is abnormal or we need to change your treatment, we will call you to review the results.  Testing/Procedures: NONE  Follow-Up: At [facility], you and your health needs are our priority.  As part of our continuing mission to provide you with exceptional heart care, our providers are all part of one team.  This team includes your primary Cardiologist (physician) and Advanced Practice Providers or APPs (Physician Assistants and Nurse Practitioners) who all work together to provide you with the care you need, when you need it.  Your next appointment:   3 months  Provider:   Orren Fabry, PA-C  We recommend signing up for the patient portal called MyChart.  Sign up information is provided on this After Visit Summary.  MyChart is used to connect with patients for Virtual Visits (Telemedicine).  Patients are able to view lab/test results, encounter notes, upcoming appointments, etc.  Non-urgent messages can be sent to your provider as well.   To learn more about what you can do with MyChart, go to [email or web address].

## 2024-01-15 ENCOUNTER — Telehealth: Payer: Self-pay | Admitting: Pharmacist

## 2024-01-15 LAB — APOLIPOPROTEIN B: Apolipoprotein B: 60 mg/dL (ref <90)

## 2024-01-15 LAB — LIPID PANEL W/O CHOL/HDL RATIO

## 2024-01-15 NOTE — Telephone Encounter (Signed)
 ApoB looks great. I called [facility] to add on lipid panel and LFT

## 2024-01-16 ENCOUNTER — Ambulatory Visit: Payer: Self-pay | Admitting: Pharmacist

## 2024-01-16 LAB — HEPATIC FUNCTION PANEL
ALT: 40 IU/L (ref 0–44)
AST: 30 IU/L (ref 0–40)
Albumin: 4.4 g/dL (ref 3.8–4.9)
Alkaline Phosphatase: 66 IU/L (ref 47–123)
Bilirubin Total: 2.2 mg/dL — AB (ref 0.0–1.2)
Bilirubin, Direct: 0.6 mg/dL — AB (ref 0.00–0.40)
Total Protein: 6.8 g/dL (ref 6.0–8.5)

## 2024-01-16 LAB — SPECIMEN STATUS REPORT

## 2024-01-16 LAB — LIPID PANEL W/O CHOL/HDL RATIO
Cholesterol, Total: 104 mg/dL (ref 100–199)
HDL: 28 mg/dL — AB (ref >39)
LDL Chol Calc (NIH): 42 mg/dL (ref 0–99)
Triglycerides: 209 mg/dL — AB (ref 0–149)
VLDL Cholesterol Cal: 34 mg/dL (ref 5–40)

## 2024-01-18 ENCOUNTER — Ambulatory Visit: Payer: PRIVATE HEALTH INSURANCE | Admitting: Cardiology

## 2024-02-05 ENCOUNTER — Encounter: Payer: Self-pay | Admitting: Hematology and Oncology

## 2024-03-18 ENCOUNTER — Other Ambulatory Visit: Payer: PRIVATE HEALTH INSURANCE | Attending: Hematology and Oncology

## 2024-03-18 ENCOUNTER — Encounter: Payer: Self-pay | Admitting: Hematology and Oncology

## 2024-03-18 ENCOUNTER — Ambulatory Visit: Payer: PRIVATE HEALTH INSURANCE | Admitting: Hematology and Oncology

## 2024-03-18 VITALS — BP 138/63 | HR 67 | Temp 98.4°F | Resp 18 | Ht 70.0 in | Wt 234.4 lb

## 2024-03-18 DIAGNOSIS — D696 Thrombocytopenia, unspecified: Secondary | ICD-10-CM

## 2024-03-18 DIAGNOSIS — E119 Type 2 diabetes mellitus without complications: Secondary | ICD-10-CM | POA: Insufficient documentation

## 2024-03-18 LAB — CBC WITH DIFFERENTIAL (CANCER CENTER ONLY)
Abs Immature Granulocytes: 0.02 K/uL (ref 0.00–0.07)
Basophils Absolute: 0.1 K/uL (ref 0.0–0.1)
Basophils Relative: 1 %
Eosinophils Absolute: 0.3 K/uL (ref 0.0–0.5)
Eosinophils Relative: 3 %
HCT: 43.6 % (ref 39.0–52.0)
Hemoglobin: 16 g/dL (ref 13.0–17.0)
Immature Granulocytes: 0 %
Lymphocytes Relative: 27 %
Lymphs Abs: 2.1 K/uL (ref 0.7–4.0)
MCH: 31 pg (ref 26.0–34.0)
MCHC: 36.7 g/dL — ABNORMAL HIGH (ref 30.0–36.0)
MCV: 84.5 fL (ref 80.0–100.0)
Monocytes Absolute: 0.5 K/uL (ref 0.1–1.0)
Monocytes Relative: 7 %
Neutro Abs: 4.7 K/uL (ref 1.7–7.7)
Neutrophils Relative %: 62 %
Platelet Count: 151 K/uL (ref 150–400)
RBC: 5.16 MIL/uL (ref 4.22–5.81)
RDW: 12.6 % (ref 11.5–15.5)
WBC Count: 7.6 K/uL (ref 4.0–10.5)
nRBC: 0 % (ref 0.0–0.2)

## 2024-03-18 LAB — CMP (CANCER CENTER ONLY)
ALT: 38 U/L (ref 0–44)
AST: 33 U/L (ref 15–41)
Albumin: 4.7 g/dL (ref 3.5–5.0)
Alkaline Phosphatase: 61 U/L (ref 38–126)
Anion gap: 11 (ref 5–15)
BUN: 20 mg/dL (ref 6–20)
CO2: 26 mmol/L (ref 22–32)
Calcium: 10.1 mg/dL (ref 8.9–10.3)
Chloride: 102 mmol/L (ref 98–111)
Creatinine: 1.1 mg/dL (ref 0.61–1.24)
GFR, Estimated: >60 mL/min (ref >60)
Glucose, Bld: 124 mg/dL — ABNORMAL HIGH (ref 70–99)
Potassium: 4.5 mmol/L (ref 3.5–5.1)
Sodium: 139 mmol/L (ref 135–145)
Total Bilirubin: 2.3 mg/dL — ABNORMAL HIGH (ref 0.0–1.2)
Total Protein: 7.7 g/dL (ref 6.5–8.1)

## 2024-03-18 LAB — VITAMIN B12: Vitamin B-12: 571 pg/mL (ref 180–914)

## 2024-03-18 MED ORDER — METFORMIN HCL 500 MG PO TABS: 500.0000 mg | ORAL_TABLET | Freq: Three times a day (TID) | ORAL | 6 refills | Status: DC

## 2024-03-18 NOTE — Assessment & Plan Note (Addendum)
 He is noted to have chronic intermittent thrombocytopenia since 2012 He had prior imaging study which shows signs of a fatty liver disease This is a most likely cause of his thrombocytopenia Repeat platelet count today is normal

## 2024-03-18 NOTE — Progress Notes (Signed)
 [facility] OFFICE PROGRESS NOTE  Patient Care Team: Frederik Charleston, MD as PCP - General (Family Medicine) Lynwood Schilling, MD as PCP - Cardiology (Cardiology) Duke, Jon Garre, PA as Physician Assistant (Cardiology)  Assessment & Plan Thrombocytopenia He is noted to have chronic intermittent thrombocytopenia since 2012 He had prior imaging study which shows signs of a fatty liver disease This is a most likely cause of his thrombocytopenia Repeat platelet count today is normal Type 2 diabetes mellitus without complication, without long-term current use of insulin (HCC) He has gone through significant lifestyle changes and dietary modification with improvement of his blood sugar control I increased the dose of metformin  and recommend the patient to follow-up with his primary care doctor in the future  No orders of the defined types were placed in this encounter.    Almarie Bedford, MD  INTERVAL HISTORY: he returns for surveillance follow-up for history of thrombocytopenia Since last time I saw him, he started taking metformin  and perform significant lifestyle changes Patient denies recent bleeding such as epistaxis, hematuria or hematochezia We reviewed medication list and discussed medication changes We discussed test results and future plan of care as outlined above  PHYSICAL EXAMINATION: ECOG PERFORMANCE STATUS: 0 - Asymptomatic  Vitals:   03/18/24 1214  BP: 138/63  Pulse: 67  Resp: 18  Temp: 98.4 F (36.9 C)  SpO2: 100%   Lab Results  Component Value Date   WBC 7.6 03/18/2024   HGB 16.0 03/18/2024   HCT 43.6 03/18/2024   MCV 84.5 03/18/2024   PLT 151 03/18/2024   SUMMARY OF HEMATOLOGIC HISTORY:  He was found to have abnormal CBC from routine labs I have the opportunity to review his CBC dated back to 2012 On September 22, 2010, his platelet count was 136 Between 20 12-20 25, he has intermittent low platelet count between 120 to normal With his recent  labs with his primary care doctor from July 2025, his hemoglobin A1c was 9%, fasting sugar of 269, platelet of 135, chronic elevated total bilirubin but normal liver enzymes  He denies recent bruising/bleeding, such as spontaneous epistaxis, hematuria, melena or hematochezia The patient has of 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 He denies prior blood or platelet transfusions He has significant alcohol use, 6-8 beers per week and bourbon 3-4 a week He has not been started on medication for diabetes He does not exercise in general Between September to December 2022, the patient almost completely quit drinking alcohol and started taking metformin  for diabetes.  He also started exercising and felt much better

## 2024-03-18 NOTE — Assessment & Plan Note (Addendum)
 He has gone through significant lifestyle changes and dietary modification with improvement of his blood sugar control I increased the dose of metformin  and recommend the patient to follow-up with his primary care doctor in the future

## 2024-03-23 ENCOUNTER — Other Ambulatory Visit: Payer: Self-pay | Admitting: Hematology and Oncology

## 2024-03-31 NOTE — Telephone Encounter (Signed)
 This was filled on 12/16 for a 30 day supply. Should not be due yet. Andrea CHRISTELLA Plunk, RN

## 2024-04-11 NOTE — Progress Notes (Unsigned)
 " Cardiology Office Note   Date:  04/11/2024  ID:  ZEN CEDILLOS, DOB [birth year], MRN [id number] PCP: Frederik Charleston, MD  [facility] Providers Cardiologist:  Lynwood Schilling, MD Cardiology APP:  Madie Jon Garre, [place] {   History of Present Illness Andrew Price is a 59 y.o. male with a past medical history of CAD status post stenting of proximal LAD September 2024, shortness of breath, sleep apnea here for follow-up appointment.   He was seen in December 2024 and since then had done well.  Was having some muscle aches on Crestor  but was not severe.  No new shortness of breath, PND, orthopnea.  No reported palpitations, presyncope or syncope.  Exercising at rehab and doing an e-bike for cardiovascular endurance.  Also likes walking for exercise.  He was not having any previous symptoms such as chest pain or shortness of breath.  I saw him 01/2024, he presents with hypertension and coronary artery disease with complaint of elevated blood pressure and chest tightness.  Blood pressure has been elevated over the past few weeks, with home readings averaging 158/85 mmHg and peaking at 173/102 mmHg. He takes 20 mg of lisinopril  in the morning, increased from 10 mg after a recent spike. Blood pressure today is 116/70 mmHg.  He has coronary artery disease with a stent placed in September of the previous year. Over the past month, he experiences intermittent chest tightness at rest and with activity, lasting 30 minutes to an hour, resolving without nitroglycerin . He recalls waking up with chest tightness and sweating earlier this week. No nitroglycerin  use for chest tightness.  He takes fish oil capsules at night. Recent lab work shows elevated triglycerides at 1477 mg/dL despite being on a generic form of Vascepa . A1c has increased from 6.1% in September to 9.2%. He is not on any medication for diabetes.  We have encouraged him to discuss this with his primary at his next  visit.  No SOB. No edema, orthopnea, PND. Reports no palpitations.   Discussed the use of AI scribe software for clinical note transcription with the patient, who gave verbal consent to proceed.  Today, he ***  ROS: Pertinent ROS in HPI  Studies Reviewed     Cardiac catheterization 12/21/2022   Left Anterior Descending  The vessel exhibits minimal luminal irregularities.  Prox LAD lesion is 90% stenosed. Vessel is the culprit lesion.    Left Circumflex  The vessel exhibits minimal luminal irregularities.    Right Coronary Artery  The vessel exhibits minimal luminal irregularities.    Intervention   Prox LAD lesion  Stent  CATH LAUNCHER 6FR EBU3.5 guide catheter was inserted. Lesion crossed with guidewire using a WIRE RUNTHROUGH .[id number]. Pre-stent angioplasty was performed using a BALLN EMERGE MR 3.0X12. A drug-eluting stent was successfully placed using a SYNERGY XD 4.0X16. Stent strut is well apposed. Post-stent angioplasty was performed using a BALL SAPPHIRE NC24 4.5X8.  Post-Intervention Lesion Assessment  The intervention was successful. Pre-interventional TIMI flow is 3. Post-intervention TIMI flow is 3. No complications occurred at this lesion. Ultrasound (IVUS) was performed on the lesion post PCI using a CATH OPTICROSS HD. Stent well apposed.  There is a 0% residual stenosis post intervention.     Wall Motion              Left Heart  Left Ventricle The left ventricular size is normal. The left ventricular systolic function is normal. LV end diastolic pressure is normal. The left ventricular ejection fraction  is 55-65% by visual estimate. No regional wall motion abnormalities.  Aortic Valve There is no aortic valve stenosis.   Coronary Diagrams  Diagnostic Dominance: Right  Intervention  Intervention    Implants      Physical Exam VS:  There were no vitals taken for this visit.       Wt Readings from Last 3 Encounters:  03/18/24 234 lb 6.4 oz  (106.3 kg)  01/14/24 236 lb (107 kg)  12/27/23 236 lb 12.8 oz (107.4 kg)    GEN: Well nourished, well developed in no acute distress NECK: No JVD; No carotid bruits CARDIAC: RRR, no murmurs, rubs, gallops RESPIRATORY:  Clear to auscultation without rales, wheezing or rhonchi  ABDOMEN: Soft, non-tender, non-distended EXTREMITIES:  No edema; No deformity   ASSESSMENT AND PLAN  Chest pain Intermittent chest tightness, concern for stent patency due to history of LAD stent placement. - Order treadmill stress test to evaluate EKG changes and assess stent patency. -triglycerides now 1477, refer to pharmD lipid clinic. Working on PA for Repatha  Hypertension Elevated home blood pressure readings, current office reading lower than usual. Lisinopril  dose recently increased. - Continue lisinopril  20 mg daily. - Monitor blood pressure at home, recording readings 1-2 hours after medication in the morning and 12 hours later in the evening. - Consider replacing blood pressure cuff if over two years old. - Send blood pressure readings via MyChart for review.  Hypertriglyceridemia Triglycerides at 1477 mg/dL, current treatment ineffective. Discussed injectable options for significant reduction. - Initiate prior authorization process for Repatha or Praluent. - Provide literature on Repatha and Praluent. - Schedule follow-up lipid panel in three months to assess triglyceride levels. -continue vascepa    Type 2 diabetes mellitus A1c increased to 9.2%, indicating poor glycemic control. No current diabetes medication. - Discuss potential initiation of diabetes medication such as Jardiance or Farxiga with primary care provider. - Monitor carbohydrate intake and blood sugar levels.   OSA - Compliant with CPAP and tolerating     Informed Consent   Shared Decision Making/Informed Consent{  The risks [chest pain, shortness of breath, cardiac arrhythmias, dizziness, blood pressure fluctuations,  myocardial infarction, stroke/transient ischemic attack, and life-threatening complications (estimated to be 1 in 10,000)], benefits (risk stratification, diagnosing coronary artery disease, treatment guidance) and alternatives of an exercise tolerance test were discussed in detail with Mr. Beg and he agrees to proceed.     Dispo: Follow-up in three months, sooner based on stress test results.   Signed, Orren LOISE Fabry, PA-C   "

## 2024-04-14 ENCOUNTER — Ambulatory Visit: Payer: PRIVATE HEALTH INSURANCE | Attending: Physician Assistant | Admitting: Physician Assistant

## 2024-04-14 ENCOUNTER — Encounter: Payer: Self-pay | Admitting: Physician Assistant

## 2024-04-14 VITALS — BP 120/74 | HR 69 | Ht 70.0 in | Wt 239.0 lb

## 2024-04-14 DIAGNOSIS — I2510 Atherosclerotic heart disease of native coronary artery without angina pectoris: Secondary | ICD-10-CM

## 2024-04-14 DIAGNOSIS — Z794 Long term (current) use of insulin: Secondary | ICD-10-CM

## 2024-04-14 DIAGNOSIS — E1169 Type 2 diabetes mellitus with other specified complication: Secondary | ICD-10-CM

## 2024-04-14 DIAGNOSIS — I10 Essential (primary) hypertension: Secondary | ICD-10-CM | POA: Diagnosis not present

## 2024-04-14 DIAGNOSIS — E785 Hyperlipidemia, unspecified: Secondary | ICD-10-CM

## 2024-04-14 DIAGNOSIS — E669 Obesity, unspecified: Secondary | ICD-10-CM

## 2024-04-14 DIAGNOSIS — Z9861 Coronary angioplasty status: Secondary | ICD-10-CM

## 2024-04-14 DIAGNOSIS — R0602 Shortness of breath: Secondary | ICD-10-CM

## 2024-04-14 MED ORDER — NITROGLYCERIN 0.4 MG SL SUBL: 0.4000 mg | SUBLINGUAL_TABLET | SUBLINGUAL | 2 refills | Status: AC | PRN

## 2024-04-14 NOTE — Patient Instructions (Signed)
 Medication Instructions:  Your physician recommends that you continue on your current medications as directed. Please refer to the Current Medication list given to you today. *If you need a refill on your cardiac medications before your next appointment, please call your pharmacy*  Lab Work: Have your pcp check your A1C If you have labs (blood work) drawn today and your tests are completely normal, you will receive your results only by: MyChart Message (if you have MyChart) OR A paper copy in the mail If you have any lab test that is abnormal or we need to change your treatment, we will call you to review the results.  Testing/Procedures: None ordered  Follow-Up: At [facility], you and your health needs are our priority.  As part of our continuing mission to provide you with exceptional heart care, our providers are all part of one team.  This team includes your primary Cardiologist (physician) and Advanced Practice Providers or APPs (Physician Assistants and Nurse Practitioners) who all work together to provide you with the care you need, when you need it.  Your next appointment:   4 month(s)  Provider:   Lynwood Schilling, MD    We recommend signing up for the patient portal called MyChart.  Sign up information is provided on this After Visit Summary.  MyChart is used to connect with patients for Virtual Visits (Telemedicine).  Patients are able to view lab/test results, encounter notes, upcoming appointments, etc.  Non-urgent messages can be sent to your provider as well.   To learn more about what you can do with MyChart, go to [email or web address].   Other Instructions

## 2024-08-01 ENCOUNTER — Ambulatory Visit: Payer: Self-pay | Admitting: Cardiology
